# Patient Record
Sex: Male | Born: 1980 | Race: Black or African American | Hispanic: No | Marital: Single | Smoking: Current every day smoker
Health system: Southern US, Community
[De-identification: ages and names within clinical notes are randomized; demographics above are authoritative.]

## PROBLEM LIST (undated history)

## (undated) DIAGNOSIS — E7800 Pure hypercholesterolemia, unspecified: Secondary | ICD-10-CM

## (undated) DIAGNOSIS — E669 Obesity, unspecified: Secondary | ICD-10-CM

## (undated) DIAGNOSIS — E119 Type 2 diabetes mellitus without complications: Secondary | ICD-10-CM

## (undated) DIAGNOSIS — N529 Male erectile dysfunction, unspecified: Secondary | ICD-10-CM

## (undated) DIAGNOSIS — I10 Essential (primary) hypertension: Secondary | ICD-10-CM

## (undated) HISTORY — DX: Male erectile dysfunction, unspecified: N52.9

## (undated) HISTORY — DX: Pure hypercholesterolemia, unspecified: E78.00

---

## 2013-02-13 ENCOUNTER — Ambulatory Visit: Payer: Self-pay

## 2013-02-14 ENCOUNTER — Encounter (HOSPITAL_COMMUNITY): Payer: Self-pay | Admitting: Emergency Medicine

## 2013-02-14 ENCOUNTER — Emergency Department (HOSPITAL_COMMUNITY)
Admission: EM | Admit: 2013-02-14 | Discharge: 2013-02-14 | Disposition: A | Discharge status: Home / Self Care | Payer: Medicaid Other | Attending: Emergency Medicine | Admitting: Emergency Medicine

## 2013-02-14 DIAGNOSIS — Z760 Encounter for issue of repeat prescription: Secondary | ICD-10-CM

## 2013-02-14 DIAGNOSIS — Z79899 Other long term (current) drug therapy: Secondary | ICD-10-CM | POA: Insufficient documentation

## 2013-02-14 DIAGNOSIS — E119 Type 2 diabetes mellitus without complications: Secondary | ICD-10-CM | POA: Insufficient documentation

## 2013-02-14 DIAGNOSIS — Z880 Allergy status to penicillin: Secondary | ICD-10-CM | POA: Insufficient documentation

## 2013-02-14 DIAGNOSIS — F17200 Nicotine dependence, unspecified, uncomplicated: Secondary | ICD-10-CM | POA: Insufficient documentation

## 2013-02-14 DIAGNOSIS — I10 Essential (primary) hypertension: Secondary | ICD-10-CM | POA: Insufficient documentation

## 2013-02-14 HISTORY — DX: Type 2 diabetes mellitus without complications: E11.9

## 2013-02-14 HISTORY — DX: Essential (primary) hypertension: I10

## 2013-02-14 LAB — GLUCOSE, CAPILLARY: Glucose-Capillary: 93 mg/dL (ref 70–99)

## 2013-02-14 MED ORDER — METFORMIN HCL 500 MG PO TABS: 500.0000 mg | ORAL_TABLET | Freq: Every day | ORAL | Status: DC

## 2013-02-14 MED ORDER — LISINOPRIL 10 MG PO TABS: 10.0000 mg | ORAL_TABLET | Freq: Every day | ORAL | Status: DC

## 2013-02-14 NOTE — ED Provider Notes (Signed)
CSN: [id number]     Arrival date & time 02/14/13  4098 History  This chart was scribed for non-physician practitioner, Lemont Fillers A Brittnie Lewey. PA-C, working with Gilda Crease, MD by Shari Heritage, ED Scribe. This patient was seen in room TR06C/TR06C and the patient's care was started at 10:27 AM.    Chief Complaint  Patient presents with  . Medication Refill    The history is provided by the patient. No language interpreter was used.     HPI Comments: Dillon Parsons is a 33 y.o. male with history of DM and HTN who presents to the Emergency Department requesting a medication refill on his metformin (500 mg daily) and lisinopril (10 mg daily). He says that he has been taking both of these medications for about 7 months and he ran out about 1 week ago. He has an appointment at Urgent Care on 02/27/13, but would like to get back on his medications. His CBG at 10:03 AM was 93.  He does not have a PCP and states he just moved to the area recently.    Past Medical History  Diagnosis Date  . Diabetes mellitus without complication   . Hypertension    History reviewed. No pertinent past surgical history. No family history on file. History  Substance Use Topics  . Smoking status: Current Every Day Smoker    Last Attempt to Quit: 09/14/2012  . Smokeless tobacco: Not on file  . Alcohol Use: Yes     Comment: rarely    Review of Systems  Constitutional: Negative for fever.  Neurological: Negative for headaches.    Allergies  Penicillins  Home Medications   Current Outpatient Rx  Name  Route  Sig  Dispense  Refill  . lisinopril (PRINIVIL,ZESTRIL) 10 MG tablet   Oral   Take 10 mg by mouth daily.         . metFORMIN (GLUCOPHAGE) 500 MG tablet   Oral   Take 500 mg by mouth daily with breakfast.          Triage Vitals: BP 127/73  Pulse 82  Temp(Src) 98 F (36.7 C) (Oral)  Resp 16  Ht 5\' 8"  (1.727 m)  Wt 340 lb 8 oz (154.45 kg)  BMI 51.78 kg/m2  SpO2 97% Physical Exam   Nursing note and vitals reviewed. Constitutional: He is oriented to person, place, and time. He appears well-developed and well-nourished. No distress.  HENT:  Head: Normocephalic and atraumatic.  Eyes: EOM are normal.  Neck: Neck supple. No tracheal deviation present.  Cardiovascular: Normal rate.   Pulmonary/Chest: Effort normal. No respiratory distress.  Musculoskeletal: Normal range of motion.  Neurological: He is alert and oriented to person, place, and time.  Skin: Skin is warm and dry.  Psychiatric: He has a normal mood and affect. His behavior is normal.    ED Course  Procedures (including critical care time) DIAGNOSTIC STUDIES: Oxygen Saturation is 97% on room air, adequate by my interpretation.    COORDINATION OF CARE: 10:28 AM- Patient informed of current plan for treatment and evaluation and agrees with plan at this time.   Results for orders placed during the hospital encounter of 02/14/13  GLUCOSE, CAPILLARY      Result Value Range   Glucose-Capillary 93  70 - 99 mg/dL     MDM   1. Medication refill     Pt just moved from [place], here for med refill. His apt with pcp is in 2 wks. He has no complaints.  Refill for metformin 500mg  once a day and lisinopril 10mg  PO once a day provided.   Filed Vitals:   02/14/13 0940  BP: 127/73  Pulse: 82  Temp: 98 F (36.7 C)  TempSrc: Oral  Resp: 16  Height: 5\' 8"  (1.727 m)  Weight: 340 lb 8 oz (154.45 kg)  SpO2: 97%    I personally performed the services described in this documentation, which was scribed in my presence. The recorded information has been reviewed and is accurate.    Lottie Musselatyana A Amauri Keefe, PA-C 02/14/13 1036

## 2013-02-14 NOTE — ED Notes (Addendum)
Pt states he ran out of his meds 3 to 4 days ago.  Pt has a Dr. Alfonzo Beersappt 01/22.  Pt needs Rx for Metformin and Lisperdal (?) to last until then.  Pt states he has not checked in CBG in approximately 4 months.   Pt alert and oriented and in NAD at this time.

## 2013-02-14 NOTE — ED Notes (Signed)
Patient states he just need medication refills.

## 2013-02-14 NOTE — ED Notes (Signed)
CBG 93 

## 2013-02-14 NOTE — Discharge Instructions (Signed)
Continue your medications. Follow up with your doctor for recheck.    Emergency Department Resource Guide 1) Find a Doctor and Pay Out of Pocket Although you won't have to find out who is covered by your insurance plan, it is a good idea to ask around and get recommendations. You will then need to call the office and see if the doctor you have chosen will accept you as a new patient and what types of options they offer for patients who are self-pay. Some doctors offer discounts or will set up payment plans for their patients who do not have insurance, but you will need to ask so you aren't surprised when you get to your appointment.  2) Contact Your Local Health Department Not all health departments have doctors that can see patients for sick visits, but many do, so it is worth a call to see if yours does. If you don't know where your local health department is, you can check in your phone book. The CDC also has a tool to help you locate your state's health department, and many state websites also have listings of all of their local health departments.  3) Find a Walk-in Clinic If your illness is not likely to be very severe or complicated, you may want to try a walk in clinic. These are popping up all over the country in pharmacies, drugstores, and shopping centers. They're usually staffed by nurse practitioners or physician assistants that have been trained to treat common illnesses and complaints. They're usually fairly quick and inexpensive. However, if you have serious medical issues or chronic medical problems, these are probably not your best option.  No Primary Care Doctor: - Call Health Connect at  [phone] - they can help you locate a primary care doctor that  accepts your insurance, provides certain services, etc. - Physician Referral Service- [phone]  Chronic Pain Problems: Organization         Address  Phone   Notes  [facility]  [phone] Patients need  to be referred by their primary care doctor.   Medication Assistance: Organization         Address  Phone   Notes  [facility] Medication [facility] [address] [place], [place] [address] [phone] --Must be a resident of [facility] [place] -- Must have NO insurance coverage whatsoever (no Medicaid/ Medicare, etc.) -- The pt. MUST have a primary care doctor that directs their care regularly and follows them in the community   MedAssist  [phone]   [employer]  [phone]    Agencies that provide inexpensive medical care: Organization         Address  Phone   Notes  [facility]  [phone]   [facility]    [phone]) [phone]   [facility] [address] [place], [place] [address] [phone]   [facility] [address], [place] [phone]) [phone]   [facility]    [phone]   [facility]    [phone]   [facility] and [facility]  [address], [place] Phone:  [phone], Fax:  [phone] Hours of Operation:  9 am - 6 pm, M-F.  Also accepts Medicaid/Medicare and self-pay.  [facility]  [address], [place] Phone: [phone], Fax: [phone]. Hours of Operation:  8:30 am - 5:30  pm, M-F.  Also accepts Medicaid and self-pay.  [facility] [place] [address] [place] Phone: [phone]   [place], [place], [place] [phone], Ext. [phone] Mondays & Thursdays: 7-9 AM.  First 15 patients are seen on a first come, first serve basis.    [place] Providers:  Organization         Address  Phone   Notes  [facility] [address], [place] [phone] Also accepts self-pay patients.  [facility] [address], [place]  [phone]   [place], [address], [place] [phone]   [facility] [address] [place] [phone]   Lucianne Lei [address], [place]   [phone] Only accepts [place] Access [place] patients after they have their name applied to their card.   Self-Pay (no insurance) in [facility]:  Organization         Address  Phone   Notes  Sickle Cell Patients, [facility] [place] [phone]   [facility] [place] [phone]   [facility] [place]  [place], [place], [place] [phone]   [facility]/Dr. Osei-Bonsu  [address] [place] or [address], [place] [phone] Phone number for both [place] and [place] locations is the same.  [facility]gent [facility] and [facility] [address] [place] [phone]   [facility] [place] [address], [place] or [address] [phone]   [facility] [address] [place] [phone], phone; [phone], fax Sees patients 1st and 3rd Saturday of every month.  Must not qualify for public or private insurance (i.e. Medicaid, Medicare, [place] Health Choice, Veterans' Benefits)  Household income should be no more than 200% of the poverty level The clinic cannot treat you if you are pregnant or think you are pregnant  Sexually transmitted diseases are not treated at the clinic.    Dental Care: Organization         Address  Phone  Notes  [facility] [place] [phone] Accepts children up to age 25 who are enrolled in [place] or [place]; pregnant women with a Medicaid card; and children who have applied for Medicaid or [place] Health Choice, but were declined, whose  parents can pay a reduced fee at time of service.  [facility] Department of [facility]  [address], [place] [phone] Accepts children up to age 46 who are enrolled in [place] or [place]; pregnant women with a Medicaid card; and children who have applied for Medicaid or [place] Health Choice, but were declined, whose parents can pay a reduced fee at time of service.  [facility] Adult [facility] Access PRO[facility]  [place] [phone] Patients are seen by appointment only. Walk-ins are not accepted. [place] will see patients 102 years of age and older. Monday - Tuesday (8am-5pm) Most Wednesdays (8:30-5pm) $30 per visit, cash only  [facility] Adult Dental Access PROGRAM  [address], [facility] [phone] Patients are seen by appointment only. Walk-ins are not  accepted. [place] will see patients 44 years of age and older. One Wednesday Evening (Monthly: Volunteer Based).  $30 per visit, cash only  [place]  [phone] for adults; Children under age 12, call Graduate Pediatric Dentistry at [phone]. Children aged 74-14, please call [phone] to request a pediatric application.  Dental services are provided in all areas of dental care including fillings, crowns and bridges, complete and partial dentures, implants, gum treatment, root canals, and extractions. Preventive care is also provided. Treatment is provided to both adults and children. Patients are selected via a lottery and there is often a waiting list.   [facility] ([place] [address] [place]  [phone] [email or web address]   [facility] [address] [place], [place] [phone], Ext. [phone] Second and Fourth Thursday of each month, opens at 6:30 AM; Clinic ends at 9 AM.  Patients are seen on a first-come first-served basis, and a limited number are seen during each clinic.   [facility]   [address] [place]ard Danker [place], [place] [phone]   Eligibility Requirements You must have lived in [place], [place], or [place] counties for at least the last three months.   You cannot be eligible for state or federal sponsored [employer], including [employer], [place], or [employer].   You generally cannot be eligible for healthcare insurance through your employer.    How to apply: Eligibility screenings are held every Tuesday and Wednesday afternoon from 1:00 pm until 4:00 pm. You do not need an appointment for the interview!  [facility] [address] [place], [place]   [place]  [place] Department  [place]  [phone]    Behavioral Health Resources in the Community: Intensive Outpatient Programs Organization         Address  Phone  Notes  [place]. [address] [place], [place] [phone]   [facility] Outpatient [address] [place], [place]   ADS: [facility] [address] [place], [place]   [place] [address]  [place], [place] or [phone]   Substance Abuse Resources Organization         Address  Phone  Notes  [facility] Drug Services  [phone]   [place]  [phone]   [facility] [place]   Chinita Pester  [phone]   Residential & Outpatient Substance Abuse Program  [phone]   Psychological Services Organization         Address  Phone  Notes  [facility] [place]  [place]  [phone]   [place] [address] [place] or [phone]    Mobile Crisis Teams Organization         Address  Phone  Notes  [facility], Mobile Crisis Care Unit  [phone]     [facility]  [address] [place], [place]   [place] Levels [address], [place] [phone]    Self-Help/Support Groups Organization         Address  Phone             Notes  [place]. of [place] - variety of support groups  [place] Call for more information  Narcotics Anonymous (NA),  [facility] [address], °[place]  2 meetings at this location  ° °Residential Treatment Programs °Organization         Address  Phone  Notes  °[facility] [address],    °[place]  [phone]   °[facility] ° [address], [place], [place] [phone]   °[facility] [address], [place] [phone] Admissions: 8am-3pm M-F  °[facility] [address]    °[place], [place] [phone]   °[facility] [address], [place], [place] [phone]   °[facility] [address]  °[place], [place] [phone]   °[facility] - Intensive Outpatient [address], [place], [place] [phone]   °[facility] [address]  °[place], [place] 1-877-615-2722 or [phone]   °[facility] ([facility]) [address] [place], [place] [phone] Accepts Medicaid  °[facility] [address]  ° [place] 1-800-659-3381 Substance Abuse/Addiction Treatment  ° °[place] Behavioral Health Resources °Organization         Address  Phone  Notes  °[facility]  ([phone]) [phone]   °Julie Brannon, PhD [address] [place], [place]   ([phone]) [phone] or (336) [phone]   °[facility]   [address] °[place], [place] ([phone]) [phone]   °[facility] [address], [place], [place] ([phone]) [phone] Insurance/Medicaid/sponsorship through [facility]  °[facility] [address]                                     [place], [place] [phone] Therapy/tele-psych/case  °[facility] [address]  ° [place],  ([phone]) [phone]    °Dr. Arfeen  ([phone]) [phone]   °[facility] [place]  [employer] [place] [facility] 1) [address], [place] °2) [address], [place] °3)  [address], [place] ([phone]) [phone] °(336) [phone] ° °(336) [phone]   °[place] Child Abuse Hotline (336) 342-1394 or (336) [phone] (After Hours)    ° ° ° °

## 2013-02-18 NOTE — ED Provider Notes (Signed)
Medical screening examination/treatment/procedure(s) were performed by non-physician practitioner and as supervising physician I was immediately available for consultation/collaboration.  Gilda Creasehristopher J. Carling Liberman, MD 02/18/13 [phone]

## 2013-02-27 ENCOUNTER — Ambulatory Visit: Payer: Self-pay | Admitting: Internal Medicine

## 2013-03-27 ENCOUNTER — Ambulatory Visit: Payer: Medicaid Other | Admitting: Internal Medicine

## 2013-04-07 ENCOUNTER — Ambulatory Visit: Payer: Medicaid Other

## 2013-05-23 ENCOUNTER — Emergency Department (HOSPITAL_COMMUNITY)
Admission: EM | Admit: 2013-05-23 | Discharge: 2013-05-23 | Disposition: A | Discharge status: Home / Self Care | Payer: Medicaid Other | Attending: Emergency Medicine | Admitting: Emergency Medicine

## 2013-05-23 ENCOUNTER — Emergency Department (HOSPITAL_COMMUNITY): Payer: Medicaid Other

## 2013-05-23 ENCOUNTER — Encounter (HOSPITAL_COMMUNITY): Payer: Self-pay | Admitting: Emergency Medicine

## 2013-05-23 DIAGNOSIS — F17200 Nicotine dependence, unspecified, uncomplicated: Secondary | ICD-10-CM | POA: Insufficient documentation

## 2013-05-23 DIAGNOSIS — J069 Acute upper respiratory infection, unspecified: Secondary | ICD-10-CM

## 2013-05-23 DIAGNOSIS — Z880 Allergy status to penicillin: Secondary | ICD-10-CM | POA: Insufficient documentation

## 2013-05-23 DIAGNOSIS — Z760 Encounter for issue of repeat prescription: Secondary | ICD-10-CM | POA: Insufficient documentation

## 2013-05-23 DIAGNOSIS — Z79899 Other long term (current) drug therapy: Secondary | ICD-10-CM | POA: Insufficient documentation

## 2013-05-23 LAB — CBG MONITORING, ED: Glucose-Capillary: 87 mg/dL (ref 70–99)

## 2013-05-23 MED ORDER — DM-APAP-CPM 15-500-2 MG PO TABS: 1.0000 | ORAL_TABLET | Freq: Three times a day (TID) | ORAL | Status: DC | PRN

## 2013-05-23 MED ORDER — IPRATROPIUM BROMIDE 0.02 % IN SOLN
0.5000 mg | Freq: Once | RESPIRATORY_TRACT | Status: AC
  Administered 2013-05-23: 0.5 mg via RESPIRATORY_TRACT
  Filled 2013-05-23: qty 2.5

## 2013-05-23 MED ORDER — ALBUTEROL SULFATE HFA 108 (90 BASE) MCG/ACT IN AERS: 2.0000 | INHALATION_SPRAY | RESPIRATORY_TRACT | Status: AC | PRN

## 2013-05-23 MED ORDER — LISINOPRIL 10 MG PO TABS: 10.0000 mg | ORAL_TABLET | Freq: Every day | ORAL | Status: DC

## 2013-05-23 MED ORDER — ALBUTEROL SULFATE (2.5 MG/3ML) 0.083% IN NEBU
5.0000 mg | INHALATION_SOLUTION | Freq: Once | RESPIRATORY_TRACT | Status: AC
  Administered 2013-05-23: 5 mg via RESPIRATORY_TRACT
  Filled 2013-05-23: qty 6

## 2013-05-23 MED ORDER — METFORMIN HCL 500 MG PO TABS: 500.0000 mg | ORAL_TABLET | Freq: Every day | ORAL | Status: DC

## 2013-05-23 NOTE — Discharge Instructions (Signed)
Upper Respiratory Infection, Adult An upper respiratory infection (URI) is also known as the common cold. It is often caused by a type of germ (virus). Colds are easily spread (contagious). You can pass it to others by kissing, coughing, sneezing, or drinking out of the same glass. Usually, you get better in 1 or 2 weeks.  HOME CARE   Only take medicine as told by your doctor.  Use a warm mist humidifier or breathe in steam from a hot shower.  Drink enough water and fluids to keep your pee (urine) clear or pale yellow.  Get plenty of rest.  Return to work when your temperature is back to normal or as told by your doctor. You may use a face mask and wash your hands to stop your cold from spreading. GET HELP RIGHT AWAY IF:   After the first few days, you feel you are getting worse.  You have questions about your medicine.  You have chills, shortness of breath, or brown or red spit (mucus).  You have yellow or brown snot (nasal discharge) or pain in the face, especially when you bend forward.  You have a fever, puffy (swollen) neck, pain when you swallow, or white spots in the back of your throat.  You have a bad headache, ear pain, sinus pain, or chest pain.  You have a high-pitched whistling sound when you breathe in and out (wheezing).  You have a lasting cough or cough up blood.  You have sore muscles or a stiff neck. MAKE SURE YOU:   Understand these instructions.  Will watch your condition.  Will get help right away if you are not doing well or get worse. Document Released: 07/12/2007 Document Revised: 04/17/2011 Document Reviewed: 05/30/2010 [facility] Patient Information 2014 [employer], [place].  Medication Refill, Emergency Department We have refilled your medication today as a courtesy to you. It is best for your medical care, however, to take care of getting refills done through your primary caregiver's office. They have your records and can do a better job of follow-up  than we can in the emergency department. On maintenance medications, we often only prescribe enough medications to get you by until you are able to see your regular caregiver. This is a more expensive way to refill medications. In the future, please plan for refills so that you will not have to use the emergency department for this. Thank you for your help. Your help allows us to better take care of the daily emergencies that enter our department. Document Released: 05/12/2003 Document Revised: 04/17/2011 Document Reviewed: 01/23/2005 [facility] Patient Information 2014 [place]Care, [place].

## 2013-05-23 NOTE — ED Provider Notes (Signed)
CSN: [id number]     Arrival date & time 05/23/13  1947 History  This chart was scribed for non-physician practitioner Junious SilkHannah Earle Troiano, PA-C working with Shelda JakesScott W. Zackowski, MD by Joaquin MusicKristina Sanchez-Matthews, ED Scribe. This patient was seen in room TR06C/TR06C and the patient's care was started at 9:11 PM .   Chief Complaint  Patient presents with  . URI  . Medication Refill   The history is provided by the patient. No language interpreter was used.   HPI Comments: Dillon Parsons is a 33 y.o. male who presents to the Emergency Department complaining of ongoing cough with some sputum, congestion, rhinorrhea x 1 week. Pt states his sx are the same during the day and night. He reports taking OTC Equate Cold and Flu and other medications without relief. Pt denies nausea, emesis, and abd pain. No polyuria, polydipsia, polyphagia.   He is also requesting a refill of his Lisinopril and Metformin. He states his last time taking his medications was 4 days ago.  Past Medical History  Diagnosis Date  . Diabetes mellitus without complication   . Hypertension    No past surgical history on file. No family history on file. History  Substance Use Topics  . Smoking status: Current Every Day Smoker    Last Attempt to Quit: 09/14/2012  . Smokeless tobacco: Not on file  . Alcohol Use: Yes     Comment: rarely    Review of Systems  HENT: Positive for congestion and rhinorrhea.   Respiratory: Positive for cough. Negative for shortness of breath.   Cardiovascular: Negative for chest pain.  Gastrointestinal: Negative for nausea, vomiting and abdominal pain.  All other systems reviewed and are negative.  Allergies  Penicillins  Home Medications   Prior to Admission medications   Medication Sig Start Date End Date Taking? Authorizing Provider  lisinopril (PRINIVIL,ZESTRIL) 10 MG tablet Take 1 tablet (10 mg total) by mouth daily. 02/14/13  Yes Tatyana A Kirichenko, PA-C  metFORMIN (GLUCOPHAGE) 500 MG  tablet Take 500 mg by mouth daily with breakfast.   Yes Historical Provider, MD  Nutritional Supplements (COLD AND FLU PO) Take 1 capsule by mouth daily as needed (for cold symptoms).   Yes Historical Provider, MD   BP 156/95  Pulse 66  Temp(Src) 97 F (36.1 C) (Oral)  Resp 18  Ht 5\' 8"  (1.727 m)  Wt 328 lb (148.78 kg)  BMI 49.88 kg/m2  SpO2 96%  Physical Exam  Nursing note and vitals reviewed. Constitutional: He is oriented to person, place, and time. He appears well-developed and well-nourished. No distress.  Well appearing, NAD  HENT:  Head: Normocephalic and atraumatic.  Right Ear: Tympanic membrane, external ear and ear canal normal.  Left Ear: Tympanic membrane, external ear and ear canal normal.  Nose: Nose normal.  Eyes: Conjunctivae and EOM are normal.  Neck: Normal range of motion. Neck supple. No tracheal deviation present.  No nuchal rigidity or meningeal signs  Cardiovascular: Normal rate, regular rhythm, normal heart sounds, intact distal pulses and normal pulses.   Pulses:      Radial pulses are 2+ on the right side, and 2+ on the left side.  Pulmonary/Chest: Effort normal. No stridor. No respiratory distress. He has wheezes in the right lower field and the left lower field.  Expiratory wheezes.  Abdominal: Soft. He exhibits no distension. There is no tenderness.  Musculoskeletal: Normal range of motion.  Neurological: He is alert and oriented to person, place, and time.  Skin: Skin is  warm and dry. He is not diaphoretic.  Psychiatric: He has a normal mood and affect. His behavior is normal.   ED Course  Procedures  DIAGNOSTIC STUDIES: Oxygen Saturation is 96% on RA, normal by my interpretation.    COORDINATION OF CARE: 9:13 PM-Discussed treatment plan which includes discussed radiology findings with pt, will give pt a breathing tx while in the ED and will discharge pt with an inhaler. Advised pt to stay hydrated and continue taking the Tylenol PRN. Pt agreed  to plan.   10:04 PM-Re-checked pt. Pt has improved. Will discharge pt.   Labs Review Labs Reviewed  CBG MONITORING, ED    Imaging Review Dg Chest 2 View  05/23/2013   CLINICAL DATA:  Cough and congestion.  EXAM: CHEST  2 VIEW  COMPARISON:  None.  FINDINGS: The heart size and mediastinal contours are within normal limits. Both lungs are clear. The visualized skeletal structures are unremarkable.  IMPRESSION: No active cardiopulmonary disease.   Electronically Signed   By: Burman NievesWilliam  Stevens M.D.   On: 05/23/2013 21:04     EKG Interpretation None     MDM   Final diagnoses:  URI (upper respiratory infection)  Medication refill    Pt CXR negative for acute infiltrate. Patients symptoms are consistent with URI, likely viral etiology. Discussed that antibiotics are not indicated for viral infections. Pt will be discharged with symptomatic treatment.  Verbalizes understanding and is agreeable with plan. Pt is hemodynamically stable & in NAD prior to dc. Patient was given medication refill for lisinopril and metformin.    I personally performed the services described in this documentation, which was scribed in my presence. The recorded information has been reviewed and is accurate.     Mora BellmanHannah S Lyllie Cobbins, PA-C 05/24/13 [phone]

## 2013-05-23 NOTE — ED Notes (Signed)
Pt c/o cold sysmptoms since last Friday. No relief with OTC medications. Pt also need refill in blood pressure medications. Pt is A&Ox4, respirations equal and unlabored, skin warm and dry

## 2013-05-25 NOTE — ED Provider Notes (Signed)
Medical screening examination/treatment/procedure(s) were performed by non-physician practitioner and as supervising physician I was immediately available for consultation/collaboration.   EKG Interpretation None        Shelda JakesScott W. Zakirah Weingart, MD 05/25/13 1534

## 2013-08-11 ENCOUNTER — Emergency Department (HOSPITAL_COMMUNITY)
Admission: EM | Admit: 2013-08-11 | Discharge: 2013-08-11 | Disposition: A | Discharge status: Home / Self Care | Payer: Medicaid Other | Attending: Emergency Medicine | Admitting: Emergency Medicine

## 2013-08-11 ENCOUNTER — Encounter (HOSPITAL_COMMUNITY): Payer: Self-pay | Admitting: Emergency Medicine

## 2013-08-11 DIAGNOSIS — Z79899 Other long term (current) drug therapy: Secondary | ICD-10-CM | POA: Diagnosis not present

## 2013-08-11 DIAGNOSIS — Z880 Allergy status to penicillin: Secondary | ICD-10-CM | POA: Insufficient documentation

## 2013-08-11 DIAGNOSIS — E119 Type 2 diabetes mellitus without complications: Secondary | ICD-10-CM | POA: Insufficient documentation

## 2013-08-11 DIAGNOSIS — I10 Essential (primary) hypertension: Secondary | ICD-10-CM | POA: Insufficient documentation

## 2013-08-11 DIAGNOSIS — Z760 Encounter for issue of repeat prescription: Secondary | ICD-10-CM | POA: Diagnosis not present

## 2013-08-11 DIAGNOSIS — F17200 Nicotine dependence, unspecified, uncomplicated: Secondary | ICD-10-CM | POA: Diagnosis not present

## 2013-08-11 MED ORDER — METFORMIN HCL 500 MG PO TABS: 500.0000 mg | ORAL_TABLET | Freq: Every day | ORAL | Status: AC

## 2013-08-11 MED ORDER — LISINOPRIL 10 MG PO TABS: 10.0000 mg | ORAL_TABLET | Freq: Every day | ORAL | Status: DC

## 2013-08-11 NOTE — ED Notes (Signed)
Pt sts that he is out of his lisinopril and metformin. sts that last was Saturday. sts was unable to make it to his doctor due to new job. sts also had some swollen lymph nodes and has gotten better but want those checked out.

## 2013-08-11 NOTE — ED Provider Notes (Signed)
CSN: [id number]     Arrival date & time 08/11/13  0947 History  This chart was scribed for non-physician practitioner Renne CriglerJoshua Dainelle Hun working with Enid SkeensJoshua M Zavitz, MD by Carl Bestelina Holson, ED Scribe. This patient was seen in room TR11C/TR11C and the patient's care was started at 10:20 AM.     Chief Complaint  Patient presents with  . Medication Refill    HPI Comments: Dillon Parsons is a 33 y.o. male who presents to the Emergency Department needing a refill of 10MG  Lisinopril and 5MG  of Metformin.  The patient states that he takes each medication once daily.  He states that he ran out of medication 2 days ago and his sugars have been running normal.  He states that he just starting seeing a new PCP and has an appointment on September 09, 2013.    The patient is also complaining of lumps on his neck causing associated soreness. These started 1 week ago and have resolved over the past 2 days. He denies sore throat or other cold symptoms as associated symptoms.  The history is provided by the patient. No language interpreter was used.    Past Medical History  Diagnosis Date  . Diabetes mellitus without complication   . Hypertension    History reviewed. No pertinent past surgical history. History reviewed. No pertinent family history. History  Substance Use Topics  . Smoking status: Current Every Day Smoker    Last Attempt to Quit: 09/14/2012  . Smokeless tobacco: Not on file  . Alcohol Use: Yes     Comment: rarely    Review of Systems  Constitutional: Negative for fever and chills.  HENT: Negative for congestion, postnasal drip, rhinorrhea, sneezing and sore throat.   Eyes: Negative for redness.  Respiratory: Negative for cough.   Cardiovascular: Negative for chest pain.  Gastrointestinal: Negative for nausea, vomiting, abdominal pain and diarrhea.  Genitourinary: Negative for dysuria.  Musculoskeletal: Negative for myalgias.  Skin: Negative for rash.  Neurological: Negative for headaches.   Hematological: Positive for adenopathy.      Allergies  Penicillins  Home Medications   Prior to Admission medications   Medication Sig Start Date End Date Taking? Authorizing Provider  albuterol (PROVENTIL HFA;VENTOLIN HFA) 108 (90 BASE) MCG/ACT inhaler Inhale 2 puffs into the lungs every 2 (two) hours as needed for wheezing or shortness of breath (cough). 05/23/13   Ramon DredgeHannah S Merrell, PA-C  DM-APAP-CPM 15-500-2 MG TABS Take 1 tablet by mouth every 8 (eight) hours as needed. 05/23/13   Mora BellmanHannah S Merrell, PA-C  lisinopril (PRINIVIL,ZESTRIL) 10 MG tablet Take 1 tablet (10 mg total) by mouth daily. 02/14/13   Tatyana A Kirichenko, PA-C  lisinopril (PRINIVIL,ZESTRIL) 10 MG tablet Take 1 tablet (10 mg total) by mouth daily. 05/23/13   Mora BellmanHannah S Merrell, PA-C  metFORMIN (GLUCOPHAGE) 500 MG tablet Take 500 mg by mouth daily with breakfast.    Historical Provider, MD  metFORMIN (GLUCOPHAGE) 500 MG tablet Take 1 tablet (500 mg total) by mouth daily with breakfast. 05/23/13   Mora BellmanHannah S Merrell, PA-C  Nutritional Supplements (COLD AND FLU PO) Take 1 capsule by mouth daily as needed (for cold symptoms).    Historical Provider, MD   Triage Vitals: BP 148/82  Pulse 79  Temp(Src) 98.4 F (36.9 C) (Oral)  Resp 18  SpO2 98%  Physical Exam  Nursing note and vitals reviewed. Constitutional: He appears well-developed and well-nourished.  HENT:  Head: Normocephalic and atraumatic.  Eyes: Conjunctivae and EOM are normal.  Neck:  Normal range of motion. Neck supple.  Cardiovascular: Normal rate.   Pulmonary/Chest: Effort normal. No respiratory distress.  Musculoskeletal: Normal range of motion.  Lymphadenopathy:    He has no cervical adenopathy.  Neurological: He is alert.  Skin: Skin is warm and dry.  Psychiatric: He has a normal mood and affect. His behavior is normal.    ED Course  Procedures (including critical care time)  DIAGNOSTIC STUDIES: Oxygen Saturation is 98% on room air, normal by my  interpretation.    COORDINATION OF CARE: 10:22 AM- Discussed prescribing the patient enough medication to last him until his appointment with his PCP.  Advised the patient that the lumps may have been caused by a response to a minor viral infection and was not concerned enough to treat the patient's symptoms.  The patient agreed to the treatment plan.   Labs Review Labs Reviewed - No data to display  Imaging Review No results found.   EKG Interpretation None      Vital signs reviewed and are as follows: Filed Vitals:   08/11/13 0957  BP: 148/82  Pulse: 79  Temp: 98.4 F (36.9 C)  Resp: 18   Medication refill given. Encouraged PCP f/u.   MDM   Final diagnoses:  Encounter for medication refill   Med refill. Pt has PCP for f/u in next month.   Lymphadenopathy of unknown etiology resolved.   I personally performed the services described in this documentation, which was scribed in my presence. The recorded information has been reviewed and is accurate.    Renne CriglerJoshua Vaidehi Braddy, PA-C 08/11/13 1035

## 2013-08-11 NOTE — Discharge Instructions (Signed)
Please read and follow all provided instructions.  Your diagnoses today include:  1. Encounter for medication refill     Tests performed today include:  Vital signs. See below for your results today.   Medications prescribed:   Metformin - medication for diabetes   Lisinopril - medication for high blood pressure  Take any prescribed medications only as directed.  Home care instructions:  Follow any educational materials contained in this packet.  Follow-up instructions: Please follow-up with your primary care provider in the next 7 days for further evaluation of your symptoms.   Return instructions:   Please return to the Emergency Department if you experience worsening symptoms.   Please return if you have any other emergent concerns.  Additional Information:  Your vital signs today were: BP 148/82   Pulse 79   Temp(Src) 98.4 F (36.9 C) (Oral)   Resp 18   SpO2 98% If your blood pressure (BP) was elevated above 135/85 this visit, please have this repeated by your doctor within one month. --------------

## 2013-08-11 NOTE — ED Provider Notes (Signed)
Medical screening examination/treatment/procedure(s) were performed by non-physician practitioner and as supervising physician I was immediately available for consultation/collaboration.   EKG Interpretation None        Rutledge Selsor M Saunders [place], MD 08/11/13 1536 

## 2013-08-16 ENCOUNTER — Encounter (HOSPITAL_COMMUNITY): Payer: Self-pay | Admitting: Emergency Medicine

## 2013-08-16 ENCOUNTER — Emergency Department (HOSPITAL_COMMUNITY)
Admission: EM | Admit: 2013-08-16 | Discharge: 2013-08-16 | Disposition: A | Discharge status: Home / Self Care | Payer: Medicaid Other | Attending: Emergency Medicine | Admitting: Emergency Medicine

## 2013-08-16 DIAGNOSIS — H109 Unspecified conjunctivitis: Secondary | ICD-10-CM | POA: Diagnosis not present

## 2013-08-16 DIAGNOSIS — E669 Obesity, unspecified: Secondary | ICD-10-CM | POA: Diagnosis not present

## 2013-08-16 DIAGNOSIS — I10 Essential (primary) hypertension: Secondary | ICD-10-CM | POA: Insufficient documentation

## 2013-08-16 DIAGNOSIS — E119 Type 2 diabetes mellitus without complications: Secondary | ICD-10-CM | POA: Insufficient documentation

## 2013-08-16 DIAGNOSIS — F17200 Nicotine dependence, unspecified, uncomplicated: Secondary | ICD-10-CM | POA: Insufficient documentation

## 2013-08-16 DIAGNOSIS — R21 Rash and other nonspecific skin eruption: Secondary | ICD-10-CM | POA: Insufficient documentation

## 2013-08-16 DIAGNOSIS — H5789 Other specified disorders of eye and adnexa: Secondary | ICD-10-CM | POA: Diagnosis present

## 2013-08-16 DIAGNOSIS — Z880 Allergy status to penicillin: Secondary | ICD-10-CM | POA: Insufficient documentation

## 2013-08-16 DIAGNOSIS — Z792 Long term (current) use of antibiotics: Secondary | ICD-10-CM | POA: Diagnosis not present

## 2013-08-16 DIAGNOSIS — Z79899 Other long term (current) drug therapy: Secondary | ICD-10-CM | POA: Insufficient documentation

## 2013-08-16 HISTORY — DX: Obesity, unspecified: E66.9

## 2013-08-16 MED ORDER — TETRACAINE HCL 0.5 % OP SOLN
1.0000 [drp] | Freq: Once | OPHTHALMIC | Status: AC
  Administered 2013-08-16: 1 [drp] via OPHTHALMIC
  Filled 2013-08-16: qty 2

## 2013-08-16 MED ORDER — FLUORESCEIN SODIUM 1 MG OP STRP
1.0000 | ORAL_STRIP | Freq: Once | OPHTHALMIC | Status: AC
  Administered 2013-08-16: 1 via OPHTHALMIC
  Filled 2013-08-16: qty 1

## 2013-08-16 MED ORDER — ERYTHROMYCIN 5 MG/GM OP OINT: TOPICAL_OINTMENT | OPHTHALMIC | Status: AC

## 2013-08-16 NOTE — ED Provider Notes (Signed)
CSN: [id number]     Arrival date & time 08/16/13  1119 History  This chart was scribed for non-physician practitioner, Raymon MuttonMarissa Amin Fornwalt, PA-C,working with Dagmar HaitWilliam Blair Walden, MD, by Karle PlumberJennifer Tensley, ED Scribe.  This patient was seen in room TR04C/TR04C and the patient's care was started at 1:03 PM.  Chief Complaint  Patient presents with  . Eye Problem  . Rash   The history is provided by the patient. No language interpreter was used.   HPI Comments:  Dillon Parsons is a 33 y.o. obese male with h/o DM and HTN who presents to the Emergency Department complaining of a foreign body sensation and irritation to the left eye that started two days ago while pressure washing a building with antibacterial soap. He reports associated yellowish discharge, matting of the eye and swelling. Pt states he was not wearing protective eye wear. He states he washed the eye out with water for approximately five minutes. He reports instilling Visine into the eye. He denies visual change, blurriness, loss of vision, itching, photophobia or pain of the left eye. He denies congestion, cough, neck pain, neck stiffness, ear pain. He does not wear corrective lenses or contact lenses. Pt does not have an optometrist/ophthalmologist.  He is unaware of his last tetanus vaccination. Pt reports allergy to PCN with a reaction of rash. He states his CBG this morning was 90. He also reports an itchy rash behind his left ear that appeared earlier this week. He reports applying Hydrocortisone Cream to the area with minimal relief. He denies any new soaps, creams, detergents, or colognes.  He denies bleeding or draining from the rash.  Past Medical History  Diagnosis Date  . Diabetes mellitus without complication   . Hypertension   . Obesity    History reviewed. No pertinent past surgical history. History reviewed. No pertinent family history. History  Substance Use Topics  . Smoking status: Current Every Day Smoker    Last  Attempt to Quit: 09/14/2012  . Smokeless tobacco: Not on file  . Alcohol Use: Yes     Comment: rarely    Review of Systems  Eyes: Positive for discharge. Negative for photophobia, pain, itching and visual disturbance.  Skin: Positive for rash.    Allergies  Penicillins  Home Medications   Prior to Admission medications   Medication Sig Start Date End Date Taking? Authorizing Provider  albuterol (PROVENTIL HFA;VENTOLIN HFA) 108 (90 BASE) MCG/ACT inhaler Inhale 2 puffs into the lungs every 2 (two) hours as needed for wheezing or shortness of breath (cough). 05/23/13  Yes Mora BellmanHannah S Merrell, PA-C  lisinopril (PRINIVIL,ZESTRIL) 10 MG tablet Take 1 tablet (10 mg total) by mouth daily. 08/11/13  Yes Renne CriglerJoshua Geiple, PA-C  metFORMIN (GLUCOPHAGE) 500 MG tablet Take 1 tablet (500 mg total) by mouth daily with breakfast. 08/11/13  Yes Renne CriglerJoshua Geiple, PA-C  tetrahydrozoline-zinc (VISINE-AC) 0.05-0.25 % ophthalmic solution Place 2 drops into the left eye 3 (three) times daily as needed (for irritation).   Yes Historical Provider, MD  erythromycin ophthalmic ointment Place a 1/2 inch ribbon of ointment into the lower eyelid 3 times per day for 5 days. 08/16/13   Anavey Coombes, PA-C   Triage Vitals: BP 133/85  Pulse 78  Temp(Src) 98.3 F (36.8 C) (Oral)  Resp 20  Ht 5\' 8"  (1.727 m)  Wt 315 lb (142.883 kg)  BMI 47.91 kg/m2  SpO2 98% Physical Exam  Nursing note and vitals reviewed. Constitutional: He is oriented to person, place, and time. He  appears well-developed and well-nourished.  HENT:  Head: Normocephalic and atraumatic.    Mouth/Throat: Oropharynx is clear and moist. No oropharyngeal exudate.  Eyes: EOM are normal. Pupils are equal, round, and reactive to light. Lids are everted and swept, no foreign bodies found. Right eye exhibits no chemosis, no discharge, no exudate and no hordeolum. No foreign body present in the right eye. Left eye exhibits discharge. Left eye exhibits no chemosis, no  exudate and no hordeolum. No foreign body present in the left eye. Right conjunctiva is not injected. Right conjunctiva has no hemorrhage. Left conjunctiva is injected. Left conjunctiva has no hemorrhage. Right eye exhibits normal extraocular motion and no nystagmus. Left eye exhibits normal extraocular motion and no nystagmus. Right pupil is round and reactive. Left pupil is round and reactive.  Fundoscopic exam:      The right eye shows no arteriolar narrowing, no AV nicking, no exudate, no hemorrhage and no papilledema.       The left eye shows no arteriolar narrowing, no AV nicking, no exudate, no hemorrhage and no papilledema.  Slit lamp exam:      The right eye shows no corneal abrasion, no corneal flare, no corneal ulcer, no foreign body, no hyphema and no hypopyon.       The left eye shows no corneal abrasion, no corneal flare, no corneal ulcer, no foreign body, no hyphema and no hypopyon.  Mild injection identified to the sclera of the left eye and conjunctiva. Discharge of a thick yellowish discoloration identified at the medial canthus. Negative swelling to the upper and lower eyelids-negative erythema or warmth upon palpation. EOMs intact-negative pain with motion to the left eye. Negative pain upon palpation to the eye. Lids negative for foreign bodies found in. Negative uptake. Negative Seidel sign.  Neck: Normal range of motion. Neck supple. No tracheal deviation present.  Negative neck stiffness Negative nuchal rigidity  Negative cervical lymphadenopathy  Negative meningneal signs   Cardiovascular: Normal rate, regular rhythm and normal heart sounds.   Pulmonary/Chest: Effort normal and breath sounds normal. No respiratory distress. He has no wheezes. He has no rales.  Musculoskeletal: Normal range of motion.  Full ROM to upper and lower extremities without difficulty noted, negative ataxia noted.  Lymphadenopathy:    He has no cervical adenopathy.  Neurological: He is alert and  oriented to person, place, and time. No cranial nerve deficit. He exhibits normal muscle tone. Coordination normal.  Cranial nerves III-XII grossly intact Negative facial drooping Negative slurred speech  Negative aphasia  Skin: Skin is warm and dry.  Papular rash noted to the posterior aspect of the left ear and around the left ear lobe. Negative active drainage or bleeding noted. Negative seborrheic dermatitis.   Psychiatric: He has a normal mood and affect. His behavior is normal.    ED Course  Procedures (including critical care time) DIAGNOSTIC STUDIES: Oxygen Saturation is 98% on RA, normal by my interpretation.   COORDINATION OF CARE: 1:13 PM- Will instill tetracaine drops and apply fluorescein strip to check for abrasion. Pt verbalizes understanding and agrees to plan.  Medications  tetracaine (PONTOCAINE) 0.5 % ophthalmic solution 1 drop (1 drop Both Eyes Given 08/16/13 1234)  fluorescein ophthalmic strip 1 strip (1 strip Both Eyes Given 08/16/13 1233)    Labs Review Labs Reviewed - No data to display  Imaging Review No results found.   EKG Interpretation None      MDM   Final diagnoses:  Bacterial conjunctivitis of left  eye  Rash and nonspecific skin eruption    Medications  tetracaine (PONTOCAINE) 0.5 % ophthalmic solution 1 drop (1 drop Both Eyes Given 08/16/13 1234)  fluorescein ophthalmic strip 1 strip (1 strip Both Eyes Given 08/16/13 1233)   Filed Vitals:   08/16/13 1124 08/16/13 1342  BP: 133/85 139/85  Pulse: 78 62  Temp: 98.3 F (36.8 C)   TempSrc: Oral   Resp: 20 18  Height: 5\' 8"  (1.727 m)   Weight: 315 lb (142.883 kg)   SpO2: 98% 99%   I personally performed the services described in this documentation, which was scribed in my presence. The recorded information has been reviewed and is accurate.  Negative findings of corneal abrasion. Doubt acute angle glaucoma. Doubt chemosis. Doubt posterior to cellulitis. Doubt preseptal cellulitis.  Doubt iritis. Suspicion to be bacterial conjunctivitis. Definitive etiology of the rash rash appears to be a form of dermatitis. Doubt impetigo. Patient stable, afebrile. Patient not septic appearing. Discharged patient. Discharge patient with antibiotics. Discussed with patient to rest and apply cool compressions. Referred to primary care provider and ophthalmologist. .mon Patient agreed to plan of care, understood, all questions answered.   Raymon Mutton, PA-C 08/16/13 1831

## 2013-08-16 NOTE — Discharge Instructions (Signed)
Please call your doctor for a followup appointment within 24-48 hours. When you talk to your doctor please let them know that you were seen in the emergency department and have them acquire all of your records so that they can discuss the findings with you and formulate a treatment plan to fully care for your new and ongoing problems. Please call and set-up an appointment with your primary care provider to be seen and re-assessed Please call and set up an appointment with ophthalmologist and dermatologist Please avoid any physical or strenuous activity  Please take medications as prescribed Please apply hydrocortisone cream to the rash  Please apply cool compressions to the eye and to the rash  Please avoid touching the eye Please continue monitor symptoms closely and if symptoms are to worsen or change (fever greater than 101, chills, chest pain, shortness of breath, difficulty breathing, swelling to the eye, drainage the eye, and loss of vision, blurred vision, pain to the eye, pain with motion to the eye, neck pain, neck stiffness, swelling to the neck, sore throat, worsening or spreading to the rash, muffled ear) please report back to the ED immediately  Conjunctivitis Conjunctivitis is commonly called "pink eye." Conjunctivitis can be caused by bacterial or viral infection, allergies, or injuries. There is usually redness of the lining of the eye, itching, discomfort, and sometimes discharge. There may be deposits of matter along the eyelids. A viral infection usually causes a watery discharge, while a bacterial infection causes a yellowish, thick discharge. Pink eye is very contagious and spreads by direct contact. You may be given antibiotic eyedrops as part of your treatment. Before using your eye medicine, remove all drainage from the eye by washing gently with warm water and cotton balls. Continue to use the medication until you have awakened 2 mornings in a row without discharge from the eye.  Do not rub your eye. This increases the irritation and helps spread infection. Use separate towels from other household members. Wash your hands with soap and water before and after touching your eyes. Use cold compresses to reduce pain and sunglasses to relieve irritation from light. Do not wear contact lenses or wear eye makeup until the infection is gone. SEEK MEDICAL CARE IF:   Your symptoms are not better after 3 days of treatment.  You have increased pain or trouble seeing.  The outer eyelids become very red or swollen. Document Released: 03/02/2004 Document Revised: 04/17/2011 Document Reviewed: 01/23/2005 [facility] Patient Information 2015 [employer] [place]. This information is not intended to replace advice given to you by your health care provider. Make sure you discuss any questions you have with your health care provider.

## 2013-08-16 NOTE — ED Notes (Signed)
Pt reports getting dirt in his eye on Thursday at work and now drainage and irritation. Denies vision changes. Also has rash behind left ear.

## 2013-08-16 NOTE — ED Notes (Signed)
Declined W/C at D/C and was escorted to lobby by RN. 

## 2013-08-17 NOTE — ED Provider Notes (Signed)
Medical screening examination/treatment/procedure(s) were performed by non-physician practitioner and as supervising physician I was immediately available for consultation/collaboration.   EKG Interpretation None        William Lavone Barrientes, MD 08/17/13 1522 

## 2015-04-07 ENCOUNTER — Emergency Department (HOSPITAL_COMMUNITY)
Admission: EM | Admit: 2015-04-07 | Discharge: 2015-04-07 | Disposition: A | Discharge status: Home / Self Care | Payer: Medicaid Other | Attending: Physician Assistant | Admitting: Physician Assistant

## 2015-04-07 ENCOUNTER — Encounter (HOSPITAL_COMMUNITY): Payer: Self-pay | Admitting: Emergency Medicine

## 2015-04-07 DIAGNOSIS — Z79899 Other long term (current) drug therapy: Secondary | ICD-10-CM | POA: Diagnosis not present

## 2015-04-07 DIAGNOSIS — E119 Type 2 diabetes mellitus without complications: Secondary | ICD-10-CM | POA: Insufficient documentation

## 2015-04-07 DIAGNOSIS — F17210 Nicotine dependence, cigarettes, uncomplicated: Secondary | ICD-10-CM | POA: Insufficient documentation

## 2015-04-07 DIAGNOSIS — I10 Essential (primary) hypertension: Secondary | ICD-10-CM | POA: Diagnosis not present

## 2015-04-07 DIAGNOSIS — K0889 Other specified disorders of teeth and supporting structures: Secondary | ICD-10-CM | POA: Insufficient documentation

## 2015-04-07 DIAGNOSIS — K029 Dental caries, unspecified: Secondary | ICD-10-CM | POA: Insufficient documentation

## 2015-04-07 DIAGNOSIS — Z7984 Long term (current) use of oral hypoglycemic drugs: Secondary | ICD-10-CM | POA: Diagnosis not present

## 2015-04-07 DIAGNOSIS — Z760 Encounter for issue of repeat prescription: Secondary | ICD-10-CM

## 2015-04-07 DIAGNOSIS — E669 Obesity, unspecified: Secondary | ICD-10-CM | POA: Insufficient documentation

## 2015-04-07 DIAGNOSIS — Z880 Allergy status to penicillin: Secondary | ICD-10-CM | POA: Diagnosis not present

## 2015-04-07 MED ORDER — LISINOPRIL 10 MG PO TABS: 10.0000 mg | ORAL_TABLET | Freq: Every day | ORAL | Status: AC

## 2015-04-07 MED ORDER — SIMVASTATIN 20 MG PO TABS: 20.0000 mg | ORAL_TABLET | Freq: Every day | ORAL | Status: AC

## 2015-04-07 MED ORDER — CLINDAMYCIN HCL 150 MG PO CAPS: 450.0000 mg | ORAL_CAPSULE | Freq: Three times a day (TID) | ORAL | Status: AC

## 2015-04-07 NOTE — Discharge Instructions (Signed)
Dental Pain  ° ° °Dental pain may be caused by many things, including:  °Tooth decay (cavities or caries). Cavities expose the nerve of your tooth to air and hot or cold temperatures. This can cause pain or discomfort.  °Abscess or infection. A dental abscess is a collection of infected pus from a bacterial infection in the inner part of the tooth (pulp). It usually occurs at the end of the tooth's root.  °Injury.  °An unknown reason (idiopathic). °Your pain may be mild or severe. It may only occur when:  °You are chewing.  °You are exposed to hot or cold temperature.  °You are eating or drinking sugary foods or beverages, such as soda or candy. °Your pain may also be constant.  °HOME CARE INSTRUCTIONS  °Watch your dental pain for any changes. The following actions may help to lessen any discomfort that you are feeling:  °Take medicines only as directed by your dentist.  °If you were prescribed an antibiotic medicine, finish all of it even if you start to feel better.  °Keep all follow-up visits as directed by your dentist. This is important.  °Do not apply heat to the outside of your face.  °Rinse your mouth or gargle with salt water if directed by your dentist. This helps with pain and swelling.  °You can make salt water by adding ¼ tsp of salt to 1 cup of warm water. °Apply ice to the painful area of your face:  °Put ice in a plastic bag.  °Place a towel between your skin and the bag.  °Leave the ice on for 20 minutes, 2-3 times per day. °Avoid foods or drinks that cause you pain, such as:  °Very hot or very cold foods or drinks.  °Sweet or sugary foods or drinks. °SEEK MEDICAL CARE IF:  °Your pain is not controlled with medicines.  °Your symptoms are worse.  °You have new symptoms. °SEEK IMMEDIATE MEDICAL CARE IF:  °You are unable to open your mouth.  °You are having trouble breathing or swallowing.  °You have a fever.  °Your face, neck, or jaw is swollen. °This information is not intended to replace advice  given to you by your health care provider. Make sure you discuss any questions you have with your health care provider.  °Document Released: 01/23/2005 Document Revised: 06/09/2014 Document Reviewed: 01/19/2014  °Elsevier Interactive Patient Education ©2016 Elsevier Inc.  ° ° °Emergency Department Resource Guide °1) Find a Doctor and Pay Out of Pocket °Although you won't have to find out who is covered by your insurance plan, it is a good idea to ask around and get recommendations. You will then need to call the office and see if the doctor you have chosen will accept you as a new patient and what types of options they offer for patients who are self-pay. Some doctors offer discounts or will set up payment plans for their patients who do not have insurance, but you will need to ask so you aren't surprised when you get to your appointment. ° °2) Contact Your Local Health Department °Not all health departments have doctors that can see patients for sick visits, but many do, so it is worth a call to see if yours does. If you don't know where your local health department is, you can check in your phone book. The CDC also has a tool to help you locate your state's health department, and many state websites also have listings of all of their local health departments. ° °  3) Find a Walk-in Clinic °If your illness is not likely to be very severe or complicated, you may want to try a walk in clinic. These are popping up all over the country in pharmacies, drugstores, and shopping centers. They're usually staffed by nurse practitioners or physician assistants that have been trained to treat common illnesses and complaints. They're usually fairly quick and inexpensive. However, if you have serious medical issues or chronic medical problems, these are probably not your best option. ° °No Primary Care Doctor: °- Call [facility] at  832-8000 - they can help you locate a primary care doctor that  accepts your insurance,  provides certain services, etc. °- Physician Referral Service- 1-800-533-3463 ° °Chronic Pain Problems: °Organization         Address  Phone   Notes  ° [facility]  (336) 297-2271 Patients need to be referred by their primary care doctor.  ° °Medication Assistance: °Organization         Address  Phone   Notes  °[place] [facility] [address] °[place], [place] [address] (336) 641-8030 --Must be a resident of [place] °-- Must have NO insurance coverage whatsoever (no Medicaid/ Medicare, etc.) °-- The pt. MUST have a primary care doctor that directs their care regularly and follows them in the community °  °[facility]  (866) 331-1348   °[employer]  (888) 892-1162   ° °Agencies that provide inexpensive medical care: °Organization         Address  Phone   Notes  °[facility]  (336) 832-8035   °[facility]    (336) 832-7272   °[facility] [address] °[place], [place] [address] (336) 832-4777   °[facility] [address], °[place] (336) 271-4999   °[facility]    (336) 373-0678   °[facility]    (336) 272-1050   °[facility] ° [address], [place] Phone:  (336) 832-4444, Fax:  (336) 832-4440 Hours of Operation:  9 am - 6 pm, M-F.  Also accepts Medicaid/Medicare and self-pay.  °[facility] ° [address], [place] Phone: (336) 832-3150, Fax: (336) 832-3151. Hours of Operation:  8:30 am - 5:30 pm, M-F.  Also accepts Medicaid and self-pay.  °[facility] [place] [address], [place] Phone: (336) 878-6027   °[facility] [address], [place], [place] (336)723-1848, Ext. 123 Mondays & Thursdays: 7-9 AM.  First 15 patients are seen on a first come, first serve basis. °  ° °Medicaid-accepting [place] Providers: ° °Organization         Address  Phone    Notes  °[facility] [address], [place] (336) 641-2100 Also accepts self-pay patients.  °[facility] [address], [place] ° (336) 856-9996   °[facility] [address], [place] (336) 288-8857   °[facility] [address] [place] [address], [place] (336) 299-7000   °Veita Bland [address],   ° (336) 373-1557 Only accepts [place] Access Medicaid patients after they have their name applied to their card.  ° °Self-Pay (no insurance) in [place]: ° °Organization         Address  Phone   Notes  °Sickle Cell Patients, [facility] [address], [place] (336) 832-1970   °[facility] [address], [place] (336) 832-4400   °[facility] [place] ° [address], [place] (336) 992-4800   °[facility]/Dr. Osei-Bonsu ° [address] [place] [address], [place] or [address], [place] (336) 841-8500 Phone number for both [place] and [place] locations is the same.  °[facility] [address], [place] (336) 299-0000   °[facility] [address] [place] [address], [place] or [address] (336) 852-7530 °(336) 878-2260   °[facility] [address], [place] (336) 350-1642, phone; (336) 294-5005, fax Sees patients 1st and 3rd Saturday of every month.  Must not qualify for public or private insurance (i.e. Medicaid, Medicare, [place] Health Choice, Veterans' Benefits) • Household income should be no more than 200% of the poverty level •The clinic cannot treat you if you are pregnant or think you are pregnant • Sexually transmitted diseases are not treated at the clinic.  ° ° °Dental Care: °Organization         Address  Phone  Notes  °[facility] [address], [place] (336)  641-6152 Accepts children up to age 21 who are enrolled in Medicaid or [place] Health Choice; pregnant women with a Medicaid card; and children who have applied for Medicaid or [place] Health Choice, but were declined, whose parents can pay a reduced fee at time of service.  °[facility] [place]  [address], [place] (336) 641-7733 Accepts children up to age 21 who are enrolled in Medicaid or [place] Health Choice; pregnant women with a Medicaid card; and children who have applied for Medicaid or [place] Health Choice, but were declined, whose parents can pay a reduced fee at time of service.  °[facility] ° [address], [place] (336) 641-4533 Patients are seen by appointment only. Walk-ins are not accepted. [facility] will see patients 18 years of age and older. °Monday - Tuesday (8am-5pm) °Most Wednesdays (8:30-5pm) °$30 per visit, cash only  °[facility] ° [address], [place] (336) 641-4533 Patients are seen by appointment only. Walk-ins are not accepted. [facility] will see patients 18 years of age and older. °One Wednesday Evening (Monthly: Volunteer Based).  $30 per visit, cash only  °[facility]  (919) 537-3737 for adults; Children under age 4, call Graduate Pediatric Dentistry at (919) [phone]. Children aged 4-14, please call (919) [phone] to request a pediatric application. ° Dental services are provided in all areas of dental care including fillings, crowns and bridges, complete and partial dentures, implants, gum treatment, root canals, and extractions. Preventive care is also provided. Treatment is provided to both adults and children. °Patients are selected via a lottery and there is often a waiting list. °  °[facility] [address], ° ° (336) 763-8833 [email or web address] °  °[facility] [address], [place], [place] (336)723-1848, Ext.  123 Second and Fourth Thursday of each month, opens at 6:30 AM; Clinic ends at 9 AM.  Patients are seen on a first-come first-served basis, and a limited number are seen during each clinic.  ° °[facility] ° [address], [place], [place] (336) 723-7904   Eligibility Requirements °You must have lived in [place],   [place], or [place] counties for at least the last three months. °  You cannot be eligible for state or federal sponsored healthcare insurance, including Veterans Administration, Medicaid, or Medicare. °  You generally cannot be eligible for healthcare insurance through your employer.  °  How to apply: °Eligibility screenings are held every Tuesday and Wednesday afternoon from 1:00 pm until 4:00 pm. You do not need an appointment for the interview!  °[facility] [address], [place], [place] 336-631-2330   °[facility]  336-342-8273   °[place]  336-703-3100   °[facility]  336-570-6415   ° °Behavioral Health Resources in the Community: °Intensive Outpatient Programs °Organization         Address  Phone  Notes  °[facility] [address], [place], [place] 336-878-6098   °[facility] [address], [place], [place] 336-832-9800   °[facility] [address], [place], [place] ° 336-882-2125   °[facility] [address],  °[place], [place] 1-800-853-5163 or 336-641-4981   °Substance Abuse Resources °Organization         Address  Phone  Notes  °[facility]  336-882-2125   °[facility]  336-784-9470   °[facility]  336-285-9073   °[facility]  336-845-3988   °Residential & Outpatient Substance Abuse Program  1-800-659-3381   °Psychological Services °Organization         Address  Phone  Notes  ° Health  336- 832-9600   °[facility]  336- 378-7881   °[place] [address], [place] 1-800-853-5163 or 336-641-4981   ° °Mobile Crisis Teams °Organization         Address  Phone  Notes  °[facility], Mobile Crisis Care Unit  1-877-626-1772   °Assertive °[facility] ° [address] [place], [place] 336-834-9664   °Sharon DeEsch [address] °[place] 336-554-5454   ° °Self-Help/Support Groups °Organization         Address  Phone             Notes  °[facility] [place] - variety of support groups  336- 373-1402 Call for more information  °Narcotics Anonymous (NA), [facility] [address], °[place]  2 meetings at this location  ° °Residential Treatment Programs °Organization         Address  Phone  Notes  °[facility] [address],    °[place]  1-866-801-8205   °[facility] ° [address], [place], [place] 704-293-8524   °[facility] [address], [place] 336-845-3988 Admissions: 8am-3pm M-F  °[facility] [address]    °[place], [place] 336-841-1104   °[facility] [address], [place], [place] 336-379-7146   °[facility] [address]  °[place], [place] 336-285-9073   °[facility] - Intensive Outpatient [address], [place], [place] 336-852-3033   °[facility] [address]  °[place], [place] 1-877-615-2722 or 336-784-9470   °[facility] [address] [place], [place] 336-227-7417 Accepts Medicaid  °[facility] [address]  ° [place] 1-800-659-3381 Substance Abuse/Addiction Treatment  ° °[place] Behavioral Health Resources °Organization         Address  Phone  Notes  °[facility]  (888) 581-9988   °Julie Brannon, PhD [address] [place], [place]   (336) 349-5553 or (336) 951-0000   °[facility]   [address] °[place], [place] (336) 349-4454   °[facility] [address], [place], [place] (336) 342-8316 Insurance/Medicaid/sponsorship through [facility]  °[facility] [address]                                    [place], [place] (336) 342-8316 Therapy/tele-psych/case  °[facility] [address]  ° [place], [place] (336) 349-2233    °Dr. Arfeen  (336) 349-4544   °[facility] [place]  [employer] [place] [facility] 1) [address],  °2) [address], [place] °3)  [address], [place] (336) 349-3220 °(336) 342-7768 ° °(336) 342-8140   °[place] Child Abuse [facility] (336) 342-1394 or (336) 342-3537 (After Hours)    ° ° ° °

## 2015-04-07 NOTE — ED Provider Notes (Signed)
CSN: [id number]     Arrival date & time 04/07/15  4098 History   First MD Initiated Contact with Patient 04/07/15 [phone]     Chief Complaint  Patient presents with  . Dental Pain     (Consider location/radiation/quality/duration/timing/severity/associated sxs/prior Treatment) HPI   Dillon Parsons is a 35 y.o. male with PMH significant for HTN, DM, and obesity who presents with right upper chipped tooth x 7 months; however, 1 day ago he began noticing mild, constant, unchanged right-sided facial swelling.  Patient reports taking ibuprofen for his dental pain.  No aggravating factors.  Denies fever, chills, N/V/D, abdominal pain, SOB, choking, drooling, or tongue swelling. Patient has a dentist appointment next week.   Patient also is in the process of finding a new PCP.  His PCP recently passed away.  He is requesting refills of 10 mg QD lisinopril and 20 mg QD Simvastatin.    Past Medical History  Diagnosis Date  . Diabetes mellitus without complication (HCC)   . Hypertension   . Obesity    History reviewed. No pertinent past surgical history. No family history on file. Social History  Substance Use Topics  . Smoking status: Current Every Day Smoker -- 0.30 packs/day    Types: Cigarettes  . Smokeless tobacco: None  . Alcohol Use: Yes     Comment: rarely    Review of Systems All other systems negative unless otherwise stated in HPI    Allergies  Penicillins  Home Medications   Prior to Admission medications   Medication Sig Start Date End Date Taking? Authorizing Provider  lisinopril (PRINIVIL,ZESTRIL) 10 MG tablet Take 1 tablet (10 mg total) by mouth daily. 08/11/13  Yes Renne Crigler, PA-C  albuterol (PROVENTIL HFA;VENTOLIN HFA) 108 (90 BASE) MCG/ACT inhaler Inhale 2 puffs into the lungs every 2 (two) hours as needed for wheezing or shortness of breath (cough). 05/23/13   Junious Silk, PA-C  erythromycin ophthalmic ointment Place a 1/2 inch ribbon of ointment into the  lower eyelid 3 times per day for 5 days. 08/16/13   Marissa Sciacca, PA-C  metFORMIN (GLUCOPHAGE) 500 MG tablet Take 1 tablet (500 mg total) by mouth daily with breakfast. 08/11/13   Renne Crigler, PA-C  tetrahydrozoline-zinc (VISINE-AC) 0.05-0.25 % ophthalmic solution Place 2 drops into the left eye 3 (three) times daily as needed (for irritation).    Historical Provider, MD   BP 162/92 mmHg  Pulse 71  Temp(Src) 98.5 F (36.9 C) (Oral)  Resp 16  SpO2 100% Physical Exam  Constitutional: He is oriented to person, place, and time. He appears well-developed and well-nourished.  HENT:  Head: Normocephalic and atraumatic.  Mouth/Throat: Uvula is midline, oropharynx is clear and moist and mucous membranes are normal. No trismus in the jaw. Abnormal dentition. Dental caries present.  Right upper chipped molar.  No obvious dental abscess. No tongue swelling.  Mild right sided facial swelling. Right upper mandible TTP.  Airway patent. Patient tolerating secretions without difficulty.   Eyes: Conjunctivae are normal.  Neck: Normal range of motion. Neck supple.  No evidence of Ludwigs angina.  Cardiovascular: Normal rate, regular rhythm and normal heart sounds.   Pulmonary/Chest: Effort normal and breath sounds normal.  Abdominal: Bowel sounds are normal. He exhibits no distension.  Musculoskeletal:  Moves all extremities spontaneously.  Lymphadenopathy:    He has no cervical adenopathy.  Neurological: He is alert and oriented to person, place, and time.  Speech clear without dysarthria.   Skin: Skin is warm and dry.  ED Course  Procedures (including critical care time) Labs Review Labs Reviewed - No data to display  Imaging Review No results found. I have personally reviewed and evaluated these images and lab results as part of my medical decision-making.   EKG Interpretation None      MDM   Final diagnoses:  Pain due to dental caries  Medication refill    Suspect dental pain  associated with dental infection and possible dental abscess with patient afebrile, non toxic appearing and swallowing secretions well. I gave patient referral to dentist and stressed the importance of dental follow up for ultimate management of dental pain. Discussed return precautions.  Patient expresses understanding and agrees with plan.  I will also give penicillin VK and pain control. Will also refill lisinopril and simvastatin.  Patient given resource guide for establishing new PCP.      Cheri Fowler, PA-C 04/07/15 4540  Courteney Randall An, MD 04/07/15 1609

## 2015-04-07 NOTE — ED Notes (Signed)
Patient states R upper chipped tooth that has been that way for 7 months.   Patient states he has an appointment set up with dentist next week, but started having swelling in his face yesterday morning.   Patient has been taking ibuprofen for pain at home and it relieves the pain fine.

## 2017-11-26 ENCOUNTER — Encounter (HOSPITAL_COMMUNITY): Payer: Self-pay | Admitting: Emergency Medicine

## 2017-11-26 ENCOUNTER — Emergency Department (HOSPITAL_COMMUNITY)
Admission: EM | Admit: 2017-11-26 | Discharge: 2017-11-26 | Disposition: A | Discharge status: Home / Self Care | Payer: Self-pay | Attending: Emergency Medicine | Admitting: Emergency Medicine

## 2017-11-26 DIAGNOSIS — M545 Low back pain: Secondary | ICD-10-CM | POA: Insufficient documentation

## 2017-11-26 DIAGNOSIS — E119 Type 2 diabetes mellitus without complications: Secondary | ICD-10-CM | POA: Insufficient documentation

## 2017-11-26 DIAGNOSIS — G8929 Other chronic pain: Secondary | ICD-10-CM | POA: Insufficient documentation

## 2017-11-26 DIAGNOSIS — Z7984 Long term (current) use of oral hypoglycemic drugs: Secondary | ICD-10-CM | POA: Insufficient documentation

## 2017-11-26 DIAGNOSIS — Z79899 Other long term (current) drug therapy: Secondary | ICD-10-CM | POA: Insufficient documentation

## 2017-11-26 DIAGNOSIS — I10 Essential (primary) hypertension: Secondary | ICD-10-CM | POA: Insufficient documentation

## 2017-11-26 DIAGNOSIS — F17210 Nicotine dependence, cigarettes, uncomplicated: Secondary | ICD-10-CM | POA: Insufficient documentation

## 2017-11-26 NOTE — ED Notes (Signed)
Pt is now in room.

## 2017-11-26 NOTE — Discharge Instructions (Signed)

## 2017-11-26 NOTE — ED Provider Notes (Signed)
[facility] EMERGENCY DEPARTMENT Provider Note   CSN: [id number] Arrival date & time: 11/26/17  1111     History   Chief Complaint Chief Complaint  Patient presents with  . Back Pain    HPI Dillon Parsons is a 37 y.o. male with a past medical history of hypertension, obesity, DM 2, who presents today for evaluation of bilateral lower back pain.  He reports that this is been going on for approximately 2 months.  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  He says that he has taken the past few days off and his back is feeling a little bit better, however he is unsure if he can return to work.  No fevers or chills.  No recent weight loss or night sweats, no history of cancer or IV drug use.  He denies any changes to bowel or bladder function, no numbness or tingling across upper inner thighs or genitals.  HPI  Past Medical History:  Diagnosis Date  . Diabetes mellitus without complication (HCC)   . Hypertension   . Obesity     There are no active problems to display for this patient.   History reviewed. No pertinent surgical history.      Home Medications    Prior to Admission medications   Medication Sig Start Date End Date Taking? Authorizing Provider  albuterol (PROVENTIL HFA;VENTOLIN HFA) 108 (90 BASE) MCG/ACT inhaler Inhale 2 puffs into the lungs every 2 (two) hours as needed for wheezing or shortness of breath (cough). 05/23/13   Junious Silk, PA-C  clindamycin (CLEOCIN) 150 MG capsule Take 3 capsules (450 mg total) by mouth 3 (three) times daily. For 7 days. 04/07/15   Cheri Fowler, PA-C  erythromycin ophthalmic ointment Place a 1/2 inch ribbon of ointment into the lower eyelid 3 times per day for 5 days. 08/16/13   Sciacca, Marissa, PA-C  lisinopril (PRINIVIL,ZESTRIL) 10 MG tablet Take 1 tablet (10 mg total) by mouth daily. 04/07/15   Cheri Fowler, PA-C  metFORMIN (GLUCOPHAGE) 500 MG tablet Take 1 tablet (500 mg total) by mouth daily with breakfast. 08/11/13   Renne Crigler, PA-C  simvastatin (ZOCOR) 20 MG tablet Take 1 tablet (20 mg total) by mouth daily. 04/07/15   Cheri Fowler, PA-C  tetrahydrozoline-zinc (VISINE-AC) 0.05-0.25 % ophthalmic solution Place 2 drops into the left eye 3 (three) times daily as needed (for irritation).    [provider]    Family History History reviewed. No pertinent family history.  Social History Social History   Tobacco Use  . Smoking status: Current Every Day Smoker    Packs/day: 0.30    Types: Cigarettes  . Smokeless tobacco: Never Used  Substance Use Topics  . Alcohol use: Yes    Comment: rarely  . Drug use: Yes    Types: Marijuana     Allergies   Penicillins   Review of Systems Review of Systems  Constitutional: Negative for chills and fever.  Musculoskeletal: Positive for back pain. Negative for neck pain and neck stiffness.  Neurological: Negative for weakness and numbness.     Physical Exam Updated Vital Signs BP (!) 144/88 (BP Location: Right Arm)   Pulse 80   Temp 98 F (36.7 C) (Oral)   Resp 16   SpO2 99%   Physical Exam  Constitutional: He appears well-developed. No distress.  HENT:  Head: Normocephalic.  Cardiovascular: Normal rate  and intact distal pulses.  2+ PT pulses bilaterally.  Pulmonary/Chest: Effort normal.  Abdominal: Soft. He exhibits no distension. There is no tenderness.  Musculoskeletal:  Diffuse tenderness to palpation over lower back, primarily over the lumbar paraspinal muscles, however there is mild lumbar midline tenderness to palpation.  Neurological:  Gait is normal.  Sensation intact to bilateral lower extremities.  Skin: Skin is warm and dry. He is not diaphoretic.  Nursing note and vitals reviewed.    ED Treatments / Results  Labs (all labs ordered are listed, but only abnormal results are displayed) Labs Reviewed  - No data to display  EKG None  Radiology No results found.  Procedures Procedures (including critical care time)  Medications Ordered in ED Medications - No data to display   Initial Impression / Assessment and Plan / ED Course  I have reviewed the triage vital signs and the nursing notes.  Pertinent labs & imaging results that were available during my care of the patient were reviewed by me and considered in my medical decision making (see chart for details).    Patient with back pain.  No neurological deficits and normal neuro exam.  Patient can walk but states is painful.  No loss of bowel or bladder control.  No concern for cauda equina.  No fever, night sweats, weight loss, h/o cancer, IVDU.  RICE protocol and pain medicine indicated and discussed with patient.     Final Clinical Impressions(s) / ED Diagnoses   Final diagnoses:  Chronic bilateral low back pain without sciatica    ED Discharge Orders    None       Cristina Gong, PA-C 11/26/17 1405    Pricilla Loveless, MD 11/26/17 [phone]

## 2017-11-26 NOTE — ED Notes (Signed)
Pt Called with no answer x 2

## 2017-11-26 NOTE — ED Triage Notes (Signed)
Pt to ER for evaluation of lower back pain without radiation. Denies numbness, tingling, loss of bowel/bladder. Ambulatory. Reports heavy lifting with his daily job.

## 2017-11-28 ENCOUNTER — Emergency Department (HOSPITAL_COMMUNITY)
Admission: EM | Admit: 2017-11-28 | Discharge: 2017-11-28 | Disposition: A | Discharge status: Home / Self Care | Payer: Self-pay | Attending: Emergency Medicine | Admitting: Emergency Medicine

## 2017-11-28 ENCOUNTER — Other Ambulatory Visit: Payer: Self-pay

## 2017-11-28 ENCOUNTER — Encounter (HOSPITAL_COMMUNITY): Payer: Self-pay | Admitting: *Deleted

## 2017-11-28 DIAGNOSIS — I10 Essential (primary) hypertension: Secondary | ICD-10-CM | POA: Insufficient documentation

## 2017-11-28 DIAGNOSIS — Z7689 Persons encountering health services in other specified circumstances: Secondary | ICD-10-CM

## 2017-11-28 DIAGNOSIS — E119 Type 2 diabetes mellitus without complications: Secondary | ICD-10-CM | POA: Insufficient documentation

## 2017-11-28 DIAGNOSIS — Z0279 Encounter for issue of other medical certificate: Secondary | ICD-10-CM | POA: Insufficient documentation

## 2017-11-28 DIAGNOSIS — Z79899 Other long term (current) drug therapy: Secondary | ICD-10-CM | POA: Insufficient documentation

## 2017-11-28 DIAGNOSIS — F17210 Nicotine dependence, cigarettes, uncomplicated: Secondary | ICD-10-CM | POA: Insufficient documentation

## 2017-11-28 NOTE — ED Triage Notes (Signed)
Pt in requesting a note to return to work on Friday, seen here a few days ago for back pain and was given a note with restrictions and his job wont let him return with that

## 2017-11-28 NOTE — ED Notes (Signed)
Provider stated patient needs to be dimissed because seen prior no new complaints needed a different work note.

## 2017-11-28 NOTE — Discharge Instructions (Signed)
If your symptoms change or you have any of the concerns we discussed yesterday please seek additional medical care and evaluation.

## 2017-11-28 NOTE — ED Provider Notes (Signed)
[facility] EMERGENCY DEPARTMENT Provider Note   CSN: [id number] Arrival date & time: 11/28/17  3086     History   Chief Complaint Chief Complaint  Patient presents with  . Letter for School/Work    HPI Tajay Muzzy is a 37 y.o. male who presents today for requesting a work note.  I personally saw him yesterday for back pain, and then he requested a note with no heavy lifting for the next 2 weeks which was given.  He today states he has no new symptoms and his back is feeling better.  He denies any numbness or tingling across upper inner thighs or genitals, no changes to bowel or bladder function, no fevers.  He states that his only concern today is getting a note to let him go back to work.     HPI  Past Medical History:  Diagnosis Date  . Diabetes mellitus without complication (HCC)   . Hypertension   . Obesity     There are no active problems to display for this patient.   History reviewed. No pertinent surgical history.      Home Medications    Prior to Admission medications   Medication Sig Start Date End Date Taking? Authorizing Provider  albuterol (PROVENTIL HFA;VENTOLIN HFA) 108 (90 BASE) MCG/ACT inhaler Inhale 2 puffs into the lungs every 2 (two) hours as needed for wheezing or shortness of breath (cough). 05/23/13   Junious Silk, PA-C  clindamycin (CLEOCIN) 150 MG capsule Take 3 capsules (450 mg total) by mouth 3 (three) times daily. For 7 days. 04/07/15   Cheri Fowler, PA-C  erythromycin ophthalmic ointment Place a 1/2 inch ribbon of ointment into the lower eyelid 3 times per day for 5 days. 08/16/13   Sciacca, Marissa, PA-C  lisinopril (PRINIVIL,ZESTRIL) 10 MG tablet Take 1 tablet (10 mg total) by mouth daily. 04/07/15   Cheri Fowler, PA-C  metFORMIN (GLUCOPHAGE) 500 MG tablet Take 1 tablet (500 mg total) by mouth daily with breakfast. 08/11/13   Renne Crigler, PA-C  simvastatin (ZOCOR) 20 MG tablet Take 1 tablet (20 mg total) by mouth daily.  04/07/15   Cheri Fowler, PA-C  tetrahydrozoline-zinc (VISINE-AC) 0.05-0.25 % ophthalmic solution Place 2 drops into the left eye 3 (three) times daily as needed (for irritation).    [provider]    Family History History reviewed. No pertinent family history.  Social History Social History   Tobacco Use  . Smoking status: Current Every Day Smoker    Packs/day: 0.30    Types: Cigarettes  . Smokeless tobacco: Never Used  Substance Use Topics  . Alcohol use: Yes    Comment: rarely  . Drug use: Yes    Types: Marijuana     Allergies   Penicillins   Review of Systems Review of Systems  Constitutional: Negative for chills and fever.  Genitourinary: Negative for difficulty urinating.  Musculoskeletal: Positive for back pain. Negative for neck pain.  Neurological: Negative for weakness and numbness.     Physical Exam Updated Vital Signs BP (!) 155/99   Pulse 71   Temp 98 F (36.7 C) (Oral)   Resp 20   SpO2 100%   Physical Exam  Constitutional: He appears well-developed. No distress.  HENT:  Head: Normocephalic.  Cardiovascular: Normal rate.  Musculoskeletal:  Seated comfortably upright in no distress.  Ambulatory  Neurological: He is alert.  Skin: He is not diaphoretic.  Psychiatric: He has a normal mood and affect.  Nursing note and  vitals reviewed.    ED Treatments / Results  Labs (all labs ordered are listed, but only abnormal results are displayed) Labs Reviewed - No data to display  EKG None  Radiology No results found.  Procedures Procedures (including critical care time)  Medications Ordered in ED Medications - No data to display   Initial Impression / Assessment and Plan / ED Course  I have reviewed the triage vital signs and the nursing notes.  Pertinent labs & imaging results that were available during my care of the patient were reviewed by me and considered in my medical decision making (see chart for details).    Patient  presents today requesting a work note.  I personally saw him yesterday for lower back pain when he requested a work note with restrictions.  He states that he tried to go to work today and his employer would not accept it.  He denies any new physical complaints or concerns.  Says his back is feeling better and simply wants a work note today.  Work note given stating patient can return on Friday without restrictions as requested by patient.  Return precautions were discussed with patient who states their understanding.  At the time of discharge patient denied any unaddressed complaints or concerns.  Patient is agreeable for discharge home.   Final Clinical Impressions(s) / ED Diagnoses   Final diagnoses:  Return to work evaluation    ED Discharge Orders    None       Norman Clay 11/28/17 1010    Linwood Dibbles, MD 11/28/17 1017

## 2017-11-28 NOTE — ED Notes (Signed)
One touch patient see provider note

## 2019-02-04 ENCOUNTER — Ambulatory Visit: Payer: Self-pay | Attending: Internal Medicine

## 2019-02-04 DIAGNOSIS — Z20822 Contact with and (suspected) exposure to covid-19: Secondary | ICD-10-CM

## 2019-02-04 DIAGNOSIS — U071 COVID-19: Secondary | ICD-10-CM | POA: Insufficient documentation

## 2019-02-05 LAB — NOVEL CORONAVIRUS, NAA: SARS-CoV-2, NAA: DETECTED — AB

## 2019-02-06 ENCOUNTER — Telehealth: Payer: Self-pay | Admitting: *Deleted

## 2019-02-06 NOTE — Telephone Encounter (Signed)
Patient spoke with RN earlier regarding positive Covid 19 results. Calling for additional information regarding family members. Reviewed quarantine information, dates and home precautions to take to prevent others from the virus.

## 2019-12-08 ENCOUNTER — Other Ambulatory Visit: Payer: Self-pay

## 2019-12-08 ENCOUNTER — Emergency Department (HOSPITAL_COMMUNITY): Payer: Medicaid Other

## 2019-12-08 ENCOUNTER — Encounter (HOSPITAL_COMMUNITY): Payer: Self-pay

## 2019-12-08 ENCOUNTER — Emergency Department (HOSPITAL_COMMUNITY)
Admission: EM | Admit: 2019-12-08 | Discharge: 2019-12-09 | Disposition: A | Discharge status: Home / Self Care | Payer: Medicaid Other | Attending: Emergency Medicine | Admitting: Emergency Medicine

## 2019-12-08 DIAGNOSIS — Z5321 Procedure and treatment not carried out due to patient leaving prior to being seen by health care provider: Secondary | ICD-10-CM | POA: Diagnosis not present

## 2019-12-08 DIAGNOSIS — M79672 Pain in left foot: Secondary | ICD-10-CM | POA: Insufficient documentation

## 2019-12-08 NOTE — ED Triage Notes (Signed)
Pt stepped on nail while trick or treating last night. Pierced left foot.

## 2019-12-09 NOTE — ED Notes (Signed)
NA for roll call  

## 2021-03-29 ENCOUNTER — Encounter: Payer: Self-pay | Admitting: Gastroenterology

## 2021-04-02 ENCOUNTER — Other Ambulatory Visit: Payer: Self-pay

## 2021-04-02 ENCOUNTER — Encounter (HOSPITAL_COMMUNITY): Payer: Self-pay | Admitting: Emergency Medicine

## 2021-04-02 ENCOUNTER — Emergency Department (HOSPITAL_COMMUNITY)
Admission: EM | Admit: 2021-04-02 | Discharge: 2021-04-02 | Disposition: A | Discharge status: Home / Self Care | Payer: Medicaid Other | Attending: Emergency Medicine | Admitting: Emergency Medicine

## 2021-04-02 DIAGNOSIS — M25561 Pain in right knee: Secondary | ICD-10-CM | POA: Diagnosis present

## 2021-04-02 DIAGNOSIS — Z7984 Long term (current) use of oral hypoglycemic drugs: Secondary | ICD-10-CM | POA: Insufficient documentation

## 2021-04-02 DIAGNOSIS — E119 Type 2 diabetes mellitus without complications: Secondary | ICD-10-CM | POA: Insufficient documentation

## 2021-04-02 DIAGNOSIS — Z79899 Other long term (current) drug therapy: Secondary | ICD-10-CM | POA: Insufficient documentation

## 2021-04-02 MED ORDER — DICLOFENAC SODIUM 1 % EX GEL: 2.0000 g | Freq: Four times a day (QID) | CUTANEOUS | 0 refills | Status: AC | PRN

## 2021-04-02 NOTE — ED Provider Notes (Signed)
[facility] EMERGENCY DEPARTMENT Provider Note   CSN: [id number] Arrival date & time: 04/02/21  5176     History  No chief complaint on file.   Dillon Parsons is a 41 y.o. male.  The history is provided by the patient. No language interpreter was used.   This is a obese 41 year old male presenting complaining of knee pain.  Patient states last week he has 2 move requirement of furniture at his home by himself.  He felt that he may have overexerted himself and since then he has been having pain to his right knee.  He described pain as a sharp uncomfortable sensation worse with ambulating and mildly improved with taking Tylenol and ibuprofen along with ice pack.  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  He felt that his increased activity may have aggravated his knee.  He does not complain of any hip or ankle pain he denies any fever denies any numbness and have not noticed any increasing redness to his knee.  Pain is nonradiating.  No calf tenderness.  Endorse increased pain prior to go to work today thus prompting this ER visit.  Does have history of diabetes.  Home Medications Prior to Admission medications   Medication Sig Start Date End Date Taking? Authorizing Provider  albuterol (PROVENTIL HFA;VENTOLIN HFA) 108 (90 BASE) MCG/ACT inhaler Inhale 2 puffs into the lungs every 2 (two) hours as needed for wheezing or shortness of breath (cough). 05/23/13   Junious Silk, PA-C  clindamycin (CLEOCIN) 150 MG capsule Take 3 capsules (450 mg total) by mouth 3 (three) times daily. For 7 days. 04/07/15   Cheri Fowler, PA-C  erythromycin ophthalmic ointment Place a 1/2 inch ribbon of ointment into the lower eyelid 3 times per day for 5 days. 08/16/13   Sciacca, Marissa, PA-C  lisinopril (PRINIVIL,ZESTRIL) 10 MG tablet Take 1 tablet (10 mg total) by mouth  daily. 04/07/15   Cheri Fowler, PA-C  metFORMIN (GLUCOPHAGE) 500 MG tablet Take 1 tablet (500 mg total) by mouth daily with breakfast. 08/11/13   Renne Crigler, PA-C  simvastatin (ZOCOR) 20 MG tablet Take 1 tablet (20 mg total) by mouth daily. 04/07/15   Cheri Fowler, PA-C  tetrahydrozoline-zinc (VISINE-AC) 0.05-0.25 % ophthalmic solution Place 2 drops into the left eye 3 (three) times daily as needed (for irritation).    [provider]      Allergies    Penicillins    Review of Systems   Review of Systems  Constitutional:  Negative for fever.  Musculoskeletal:  Positive for arthralgias.  Skin:  Negative for wound.   Physical Exam Updated Vital Signs BP 130/79 (BP Location: Right Arm)    Pulse 72    Temp 98.6 F (37 C) (Oral)    Resp 16    SpO2 100%  Physical Exam Vitals and nursing note reviewed.  Constitutional:      General: He is not in acute distress.    Appearance: He is well-developed. He is obese.  HENT:     Head: Atraumatic.  Eyes:     Conjunctiva/sclera: Conjunctivae normal.  Musculoskeletal:        General: Tenderness (Right knee: Normal knee flexion and extension, patella is located, mild tenderness to medial lateral joint line without any erythema edema or warmth noted.  Able to flex and extend the knee and no joint  laxity.) present.     Cervical back: Neck supple.     Comments: Right hip and right ankle nontender, dorsalis pulses intact  Skin:    Findings: No rash.  Neurological:     Mental Status: He is alert.    ED Results / Procedures / Treatments   Labs (all labs ordered are listed, but only abnormal results are displayed) Labs Reviewed - No data to display  EKG None  Radiology No results found.  Procedures Procedures    Medications Ordered in ED Medications - No data to display  ED Course/ Medical Decision Making/ A&P                           Medical Decision Making  BP 130/79 (BP Location: Right Arm)    Pulse 72    Temp 98.6 F (37  C) (Oral)    Resp 16    SpO2 100%   8:34 AM Patient here with complaint of right knee pain after overexertion a week ago moving furniture's.  No direct impact to the knee and denies any specific trauma.  Pain worse with ambulation.  On exam, he does have some tenderness to his medial lateral joint line however no evidence to suggest cellulitis, no joint laxity to suggest ligamentous injury, he is able to range his knee with full range of motion, doubt septic joint.  Low suspicion for fracture or dislocation.  His patella is located.  He does not have any referred pain.  I suspect this is likely acute on chronic knee pain and will benefit from RICE therapy.  I will provide Ace wrap, and will also prescribe Voltaren gel to use as needed.  Orthopedic referral given.  Return precaution given.        Final Clinical Impression(s) / ED Diagnoses Final diagnoses:  Acute pain of right knee    Rx / DC Orders ED Discharge Orders          Ordered    diclofenac Sodium (VOLTAREN) 1 % GEL  4 times daily PRN        04/02/21 0852              Fayrene Helper, PA-C 04/02/21 1610    Derwood Kaplan, MD 04/03/21 [phone]

## 2021-04-02 NOTE — Discharge Instructions (Addendum)
Please wear ACE wrap for support.  Apply voltaren gel to your knee as needed for pain control.  Follow up with orthopedist as needed.

## 2021-04-02 NOTE — ED Notes (Addendum)
Ace bandage wrap applied.

## 2021-04-02 NOTE — ED Triage Notes (Signed)
Pt reports history of R knee pain (with decreased cartilage in knee) that he was going to PT for until last month.  States he moved furniture last weekend and pain has increased.  Unable to go to work this morning due to pain.

## 2021-04-25 ENCOUNTER — Ambulatory Visit: Payer: Medicaid Other | Admitting: Gastroenterology

## 2021-07-21 IMAGING — DX DG FOOT COMPLETE 3+V*L*
3 series · 3 of 3 positions shown · non-contrast
Comparison: None.

CLINICAL DATA: Stepped on a nail yesterday, plantar pain medial
aspect midfoot

EXAM:
LEFT FOOT - COMPLETE 3+ VIEW

[foot ap]
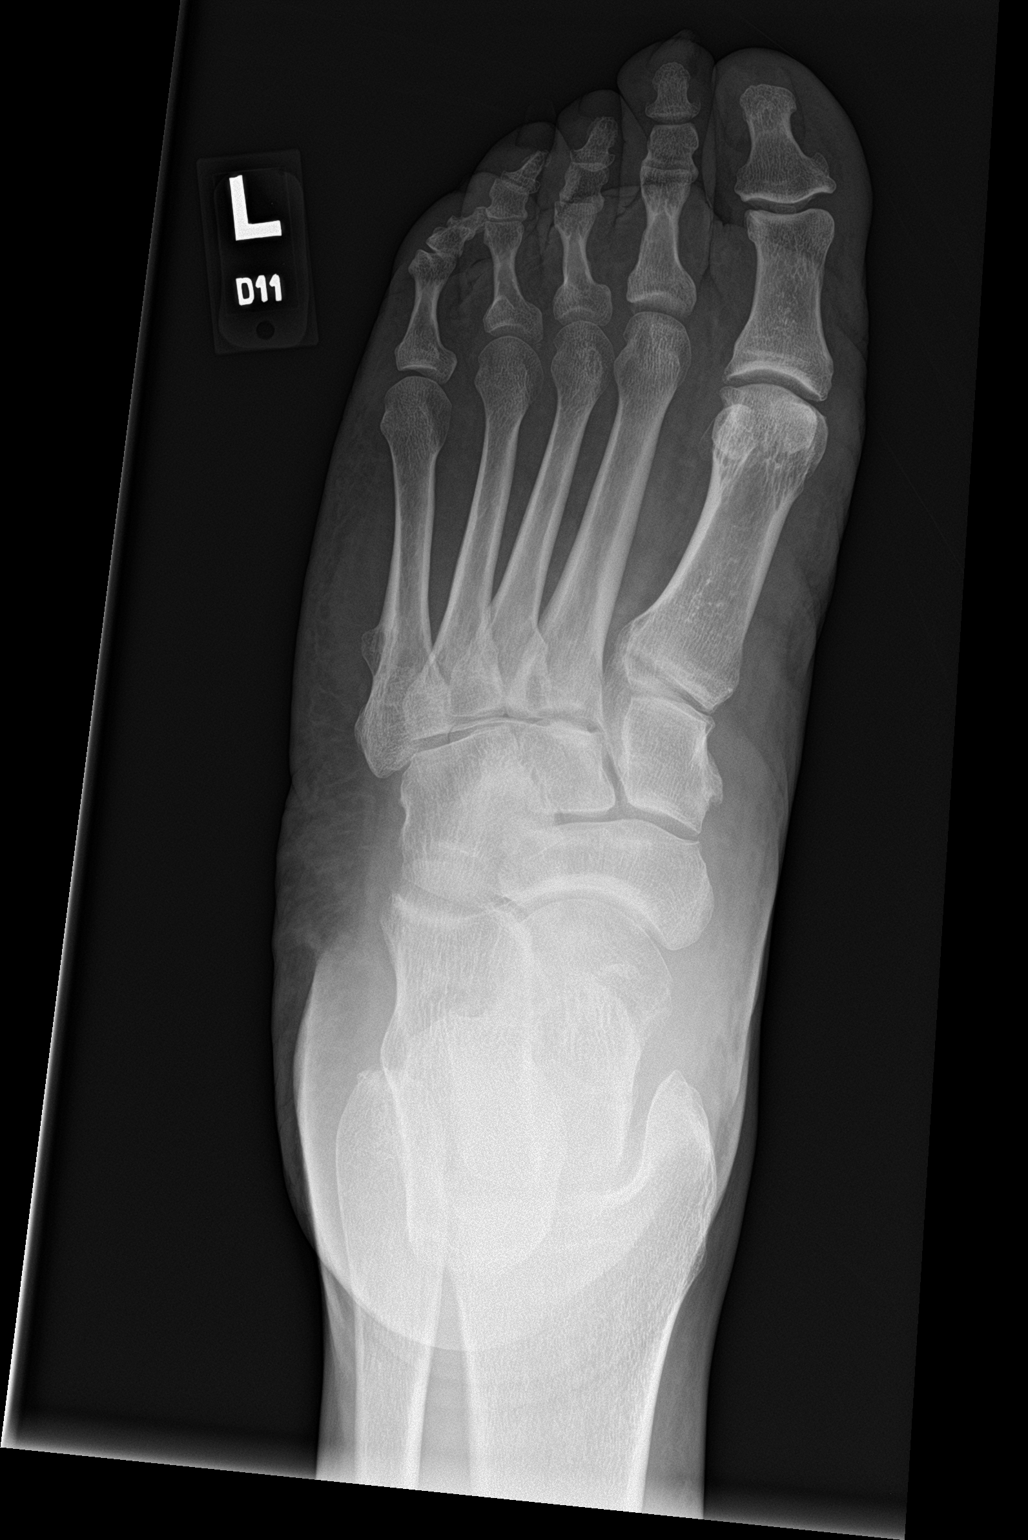

[foot obl]
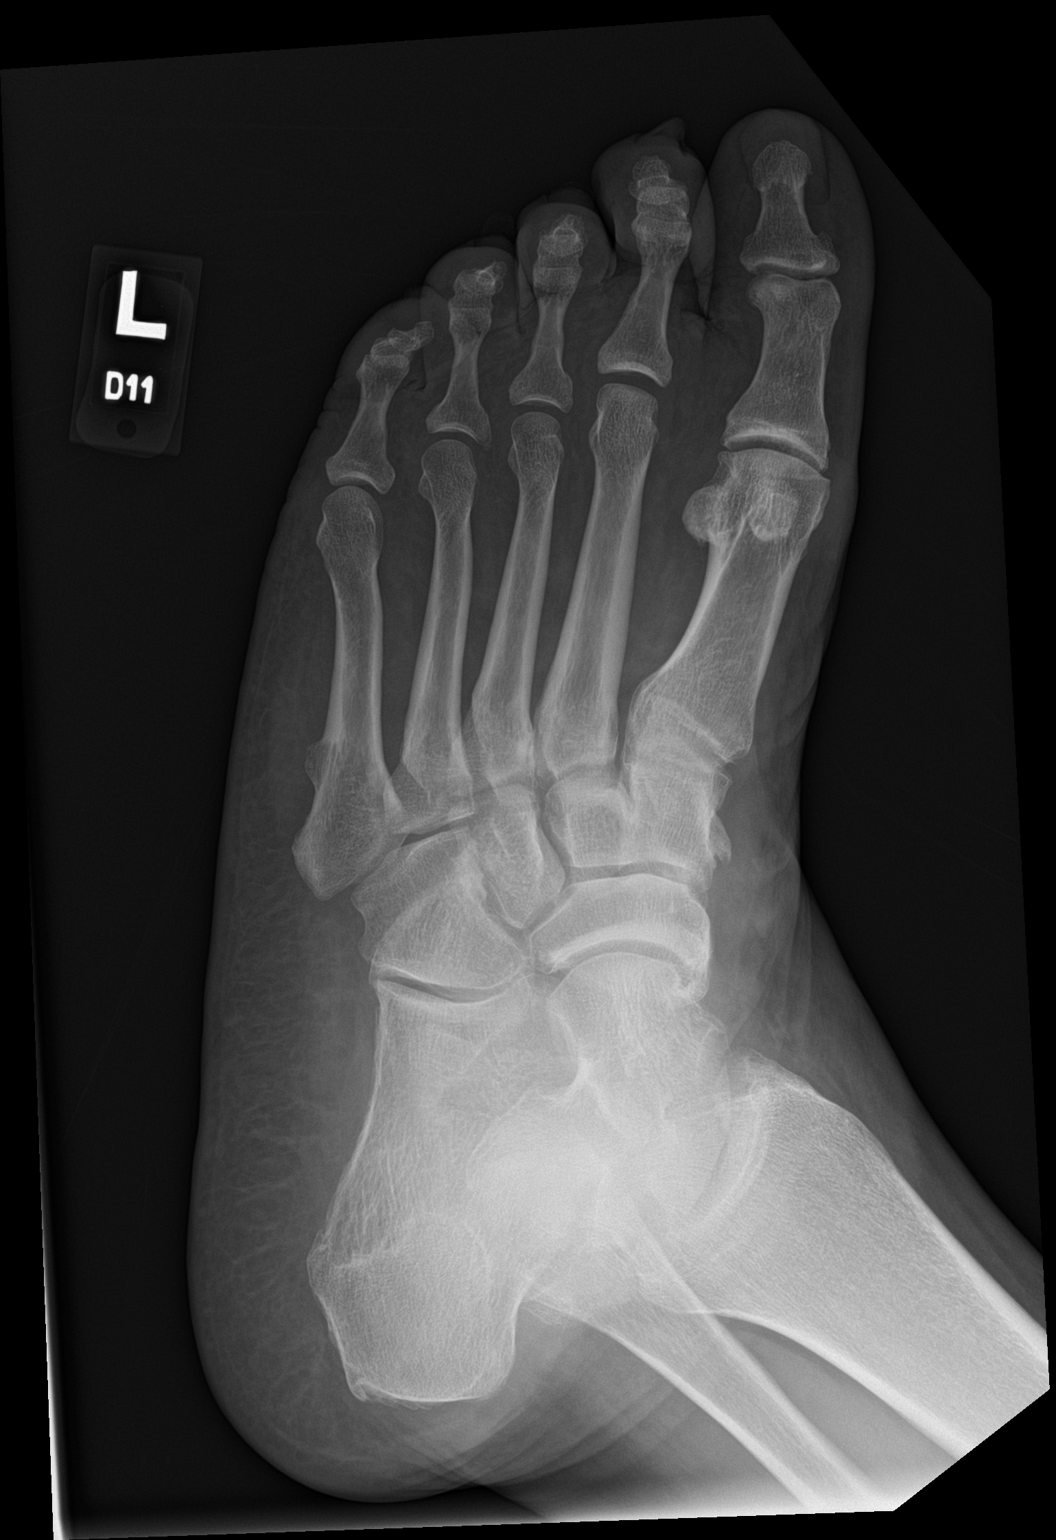

[foot lat]
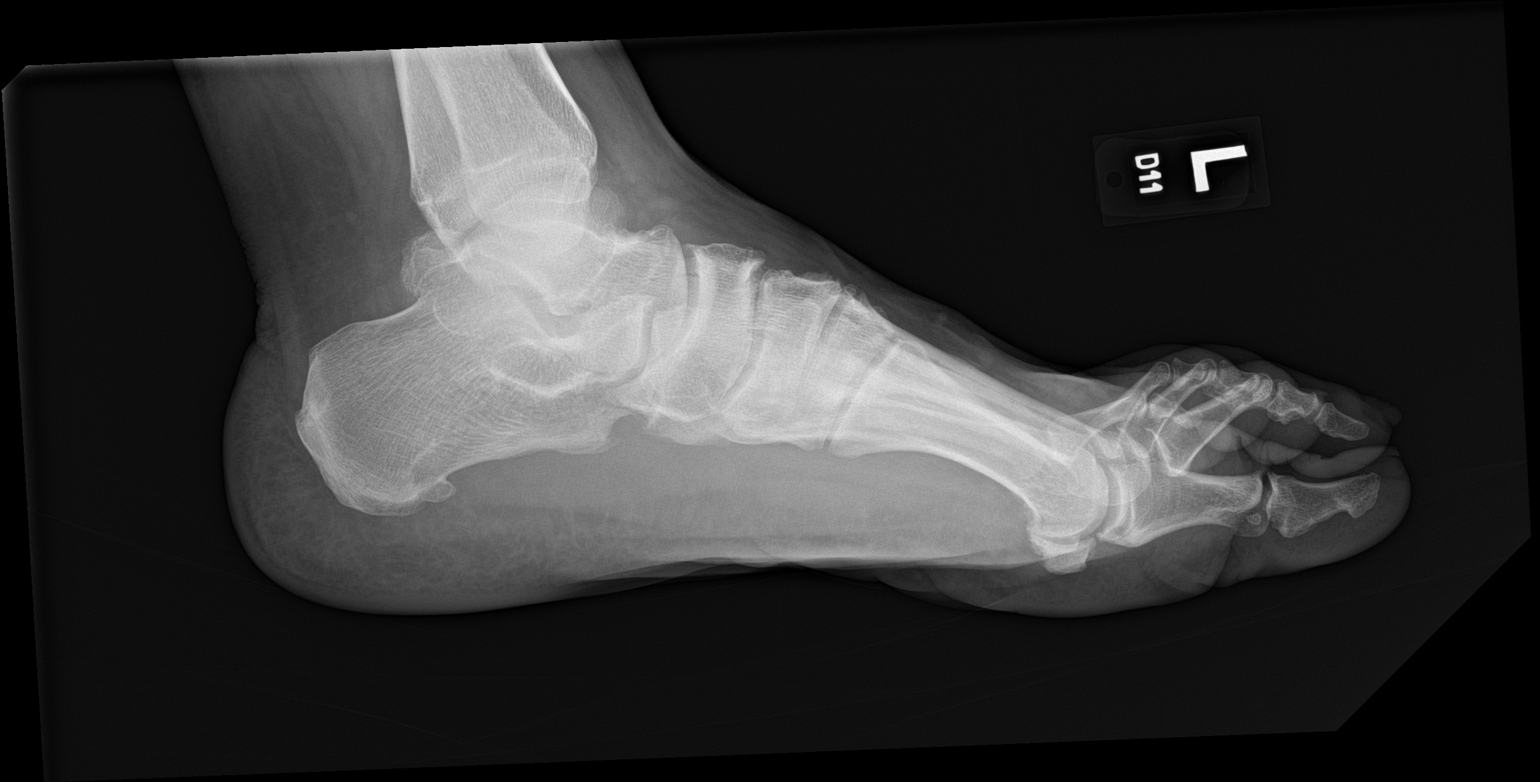

[3 of 3 positions shown; findings below may reference images not displayed]

FINDINGS: Frontal, oblique, and lateral views of the left foot are obtained.
No fracture, subluxation, or dislocation. No radiopaque foreign
body. Moderate midfoot osteoarthritis. Small inferior calcaneal
spur. Soft tissues are unremarkable.
IMPRESSION: 1. No fracture or radiopaque foreign body.
2. Midfoot osteoarthritis.

## 2022-07-04 DIAGNOSIS — F331 Major depressive disorder, recurrent, moderate: Secondary | ICD-10-CM | POA: Diagnosis not present

## 2022-07-31 DIAGNOSIS — F331 Major depressive disorder, recurrent, moderate: Secondary | ICD-10-CM | POA: Diagnosis not present

## 2024-10-06 ENCOUNTER — Ambulatory Visit: Admitting: Physician Assistant
